# Patient Record
Sex: Female | Born: 1970 | Race: White | Hispanic: No | Marital: Married | Smoking: Never smoker
Health system: Southern US, Community
[De-identification: ages and names within clinical notes are randomized; demographics above are authoritative.]

## PROBLEM LIST (undated history)

## (undated) DIAGNOSIS — D649 Anemia, unspecified: Secondary | ICD-10-CM

## (undated) DIAGNOSIS — M7732 Calcaneal spur, left foot: Secondary | ICD-10-CM

## (undated) DIAGNOSIS — Z8742 Personal history of other diseases of the female genital tract: Secondary | ICD-10-CM

## (undated) DIAGNOSIS — S83207A Unspecified tear of unspecified meniscus, current injury, left knee, initial encounter: Secondary | ICD-10-CM

## (undated) DIAGNOSIS — S83206A Unspecified tear of unspecified meniscus, current injury, right knee, initial encounter: Secondary | ICD-10-CM

## (undated) DIAGNOSIS — E785 Hyperlipidemia, unspecified: Secondary | ICD-10-CM

## (undated) HISTORY — DX: Anemia, unspecified: D64.9

## (undated) HISTORY — DX: Unspecified tear of unspecified meniscus, current injury, left knee, initial encounter: S83.207A

## (undated) HISTORY — DX: Unspecified tear of unspecified meniscus, current injury, right knee, initial encounter: S83.206A

## (undated) HISTORY — DX: Calcaneal spur, left foot: M77.32

## (undated) HISTORY — DX: Personal history of other diseases of the female genital tract: Z87.42

## (undated) HISTORY — DX: Hyperlipidemia, unspecified: E78.5

---

## 2004-04-13 DIAGNOSIS — Z8742 Personal history of other diseases of the female genital tract: Secondary | ICD-10-CM

## 2004-04-13 HISTORY — DX: Personal history of other diseases of the female genital tract: Z87.42

## 2004-04-13 HISTORY — PX: COLPOSCOPY: SHX161

## 2006-04-13 HISTORY — PX: DILATION AND CURETTAGE OF UTERUS: SHX78

## 2006-05-06 ENCOUNTER — Ambulatory Visit: Payer: Self-pay | Admitting: Obstetrics & Gynecology

## 2011-06-25 ENCOUNTER — Observation Stay: Payer: Self-pay | Admitting: Obstetrics and Gynecology

## 2011-06-29 ENCOUNTER — Inpatient Hospital Stay: Payer: Self-pay | Admitting: Internal Medicine

## 2011-06-29 LAB — CBC WITH DIFFERENTIAL/PLATELET
Basophil %: 0.3 %
Eosinophil %: 0.6 %
HGB: 11.8 g/dL — ABNORMAL LOW (ref 12.0–16.0)
Lymphocyte %: 17.2 %
MCV: 94 fL (ref 80–100)
Monocyte %: 6.1 %
Neutrophil %: 75.8 %
Platelet: 233 10*3/uL (ref 150–440)
RBC: 3.63 10*6/uL — ABNORMAL LOW (ref 3.80–5.20)
RDW: 13.4 % (ref 11.5–14.5)
WBC: 14.3 10*3/uL — ABNORMAL HIGH (ref 3.6–11.0)

## 2013-02-11 DIAGNOSIS — M7732 Calcaneal spur, left foot: Secondary | ICD-10-CM

## 2013-02-11 HISTORY — DX: Calcaneal spur, left foot: M77.32

## 2013-04-26 ENCOUNTER — Ambulatory Visit: Payer: Self-pay | Admitting: Orthopedic Surgery

## 2013-05-19 ENCOUNTER — Ambulatory Visit (INDEPENDENT_AMBULATORY_CARE_PROVIDER_SITE_OTHER): Payer: BC Managed Care – PPO

## 2013-05-19 ENCOUNTER — Encounter: Payer: Self-pay | Admitting: Podiatry

## 2013-05-19 ENCOUNTER — Ambulatory Visit (INDEPENDENT_AMBULATORY_CARE_PROVIDER_SITE_OTHER): Payer: BC Managed Care – PPO | Admitting: Podiatry

## 2013-05-19 VITALS — BP 141/85 | HR 72 | Resp 16 | Ht 65.0 in | Wt 157.0 lb

## 2013-05-19 DIAGNOSIS — M722 Plantar fascial fibromatosis: Secondary | ICD-10-CM

## 2013-05-19 MED ORDER — DICLOFENAC SODIUM 75 MG PO TBEC: 75.0000 mg | DELAYED_RELEASE_TABLET | Freq: Two times a day (BID) | ORAL | Status: DC

## 2013-05-19 MED ORDER — TRIAMCINOLONE ACETONIDE 10 MG/ML IJ SUSP
10.0000 mg | Freq: Once | INTRAMUSCULAR | Status: AC
  Administered 2013-05-19: 10 mg

## 2013-05-19 NOTE — Progress Notes (Signed)
   Subjective:    Patient ID: Marie Benson, female    DOB: 12/24/1970, 43 y.o.   MRN: [id number]  HPI Comments: The left heel is killing me it throbs, i workout and the day after working out i can barely walk on it, it hurts all the time. This has been going on since thanksgiving . Took advil and aleve nothing seems to work .  Foot Pain      Review of Systems     Objective:   Physical Exam        Assessment & Plan:

## 2013-05-19 NOTE — Patient Instructions (Signed)
Plantar Fasciitis (Heel Spur Syndrome) with Rehab The plantar fascia is a fibrous, ligament-like, soft-tissue structure that spans the bottom of the foot. Plantar fasciitis is a condition that causes pain in the foot due to inflammation of the tissue. SYMPTOMS   Pain and tenderness on the underneath side of the foot.  Pain that worsens with standing or walking. CAUSES  Plantar fasciitis is caused by irritation and injury to the plantar fascia on the underneath side of the foot. Common mechanisms of injury include:  Direct trauma to bottom of the foot.  Damage to a small nerve that runs under the foot where the main fascia attaches to the heel bone.  Stress placed on the plantar fascia due to bone spurs. RISK INCREASES WITH:   Activities that place stress on the plantar fascia (running, jumping, pivoting, or cutting).  Poor strength and flexibility.  Improperly fitted shoes.  Tight calf muscles.  Flat feet.  Failure to warm-up properly before activity.  Obesity. PREVENTION  Warm up and stretch properly before activity.  Allow for adequate recovery between workouts.  Maintain physical fitness:  Strength, flexibility, and endurance.  Cardiovascular fitness.  Maintain a health body weight.  Avoid stress on the plantar fascia.  Wear properly fitted shoes, including arch supports for individuals who have flat feet. PROGNOSIS  If treated properly, then the symptoms of plantar fasciitis usually resolve without surgery. However, occasionally surgery is necessary. RELATED COMPLICATIONS   Recurrent symptoms that may result in a chronic condition.  Problems of the lower back that are caused by compensating for the injury, such as limping.  Pain or weakness of the foot during push-off following surgery.  Chronic inflammation, scarring, and partial or complete fascia tear, occurring more often from repeated injections. TREATMENT  Treatment initially involves the use of  ice and medication to help reduce pain and inflammation. The use of strengthening and stretching exercises may help reduce pain with activity, especially stretches of the Achilles tendon. These exercises may be performed at home or with a therapist. Your caregiver may recommend that you use heel cups of arch supports to help reduce stress on the plantar fascia. Occasionally, corticosteroid injections are given to reduce inflammation. If symptoms persist for greater than 6 months despite non-surgical (conservative), then surgery may be recommended.  MEDICATION   If pain medication is necessary, then nonsteroidal anti-inflammatory medications, such as aspirin and ibuprofen, or other minor pain relievers, such as acetaminophen, are often recommended.  Do not take pain medication within 7 days before surgery.  Prescription pain relievers may be given if deemed necessary by your caregiver. Use only as directed and only as much as you need.  Corticosteroid injections may be given by your caregiver. These injections should be reserved for the most serious cases, because they may only be given a certain number of times. HEAT AND COLD  Cold treatment (icing) relieves pain and reduces inflammation. Cold treatment should be applied for 10 to 15 minutes every 2 to 3 hours for inflammation and pain and immediately after any activity that aggravates your symptoms. Use ice packs or massage the area with a piece of ice (ice massage).  Heat treatment may be used prior to performing the stretching and strengthening activities prescribed by your caregiver, physical therapist, or athletic trainer. Use a heat pack or soak the injury in warm water. SEEK IMMEDIATE MEDICAL CARE IF:  Treatment seems to offer no benefit, or the condition worsens.  Any medications produce adverse side effects. EXERCISES RANGE   OF MOTION (ROM) AND STRETCHING EXERCISES - Plantar Fasciitis (Heel Spur Syndrome) These exercises may help you  when beginning to rehabilitate your injury. Your symptoms may resolve with or without further involvement from your physician, physical therapist or athletic trainer. While completing these exercises, remember:   Restoring tissue flexibility helps normal motion to return to the joints. This allows healthier, less painful movement and activity.  An effective stretch should be held for at least 30 seconds.  A stretch should never be painful. You should only feel a gentle lengthening or release in the stretched tissue. RANGE OF MOTION - Toe Extension, Flexion  Sit with your right / left leg crossed over your opposite knee.  Grasp your toes and gently pull them back toward the top of your foot. You should feel a stretch on the bottom of your toes and/or foot.  Hold this stretch for __________ seconds.  Now, gently pull your toes toward the bottom of your foot. You should feel a stretch on the top of your toes and or foot.  Hold this stretch for __________ seconds. Repeat __________ times. Complete this stretch __________ times per day.  RANGE OF MOTION - Ankle Dorsiflexion, Active Assisted  Remove shoes and sit on a chair that is preferably not on a carpeted surface.  Place right / left foot under knee. Extend your opposite leg for support.  Keeping your heel down, slide your right / left foot back toward the chair until you feel a stretch at your ankle or calf. If you do not feel a stretch, slide your bottom forward to the edge of the chair, while still keeping your heel down.  Hold this stretch for __________ seconds. Repeat __________ times. Complete this stretch __________ times per day.  STRETCH  Gastroc, Standing  Place hands on wall.  Extend right / left leg, keeping the front knee somewhat bent.  Slightly point your toes inward on your back foot.  Keeping your right / left heel on the floor and your knee straight, shift your weight toward the wall, not allowing your back to  arch.  You should feel a gentle stretch in the right / left calf. Hold this position for __________ seconds. Repeat __________ times. Complete this stretch __________ times per day. STRETCH  Soleus, Standing  Place hands on wall.  Extend right / left leg, keeping the other knee somewhat bent.  Slightly point your toes inward on your back foot.  Keep your right / left heel on the floor, bend your back knee, and slightly shift your weight over the back leg so that you feel a gentle stretch deep in your back calf.  Hold this position for __________ seconds. Repeat __________ times. Complete this stretch __________ times per day. STRETCH  Gastrocsoleus, Standing  Note: This exercise can place a lot of stress on your foot and ankle. Please complete this exercise only if specifically instructed by your caregiver.   Place the ball of your right / left foot on a step, keeping your other foot firmly on the same step.  Hold on to the wall or a rail for balance.  Slowly lift your other foot, allowing your body weight to press your heel down over the edge of the step.  You should feel a stretch in your right / left calf.  Hold this position for __________ seconds.  Repeat this exercise with a slight bend in your right / left knee. Repeat __________ times. Complete this stretch __________ times per day.    STRENGTHENING EXERCISES - Plantar Fasciitis (Heel Spur Syndrome)  These exercises may help you when beginning to rehabilitate your injury. They may resolve your symptoms with or without further involvement from your physician, physical therapist or athletic trainer. While completing these exercises, remember:   Muscles can gain both the endurance and the strength needed for everyday activities through controlled exercises.  Complete these exercises as instructed by your physician, physical therapist or athletic trainer. Progress the resistance and repetitions only as guided. STRENGTH - Towel  Curls  Sit in a chair positioned on a non-carpeted surface.  Place your foot on a towel, keeping your heel on the floor.  Pull the towel toward your heel by only curling your toes. Keep your heel on the floor.  If instructed by your physician, physical therapist or athletic trainer, add ____________________ at the end of the towel. Repeat __________ times. Complete this exercise __________ times per day. STRENGTH - Ankle Inversion  Secure one end of a rubber exercise band/tubing to a fixed object (table, pole). Loop the other end around your foot just before your toes.  Place your fists between your knees. This will focus your strengthening at your ankle.  Slowly, pull your big toe up and in, making sure the band/tubing is positioned to resist the entire motion.  Hold this position for __________ seconds.  Have your muscles resist the band/tubing as it slowly pulls your foot back to the starting position. Repeat __________ times. Complete this exercises __________ times per day.  Document Released: 03/30/2005 Document Revised: 06/22/2011 Document Reviewed: 07/12/2008 Exit[employer] Patient Information 2014 [employer]. Plantar Fasciitis Plantar fasciitis is a common condition that causes foot pain. It is soreness (inflammation) of the band of tough fibrous tissue on the bottom of the foot that runs from the heel bone (calcaneus) to the ball of the foot. The cause of this soreness may be from excessive standing, poor fitting shoes, running on hard surfaces, being overweight, having an abnormal walk, or overuse (this is common in runners) of the painful foot or feet. It is also common in aerobic exercise dancers and ballet dancers. SYMPTOMS  Most people with plantar fasciitis complain of:  Severe pain in the morning on the bottom of their foot especially when taking the first steps out of bed. This pain recedes after a few minutes of walking.  Severe pain is experienced also during walking  following a long period of inactivity.  Pain is worse when walking barefoot or up stairs DIAGNOSIS   Your caregiver will diagnose this condition by examining and feeling your foot.  Special tests such as X-rays of your foot, are usually not needed. PREVENTION   Consult a sports medicine professional before beginning a new exercise program.  Walking programs offer a good workout. With walking there is a lower chance of overuse injuries common to runners. There is less impact and less jarring of the joints.  Begin all new exercise programs slowly. If problems or pain develop, decrease the amount of time or distance until you are at a comfortable level.  Wear good shoes and replace them regularly.  Stretch your foot and the heel cords at the back of the ankle (Achilles tendon) both before and after exercise.  Run or exercise on even surfaces that are not hard. For example, asphalt is better than pavement.  Do not run barefoot on hard surfaces.  If using a treadmill, vary the incline.  Do not continue to workout if you have foot or joint   problems. Seek professional help if they do not improve. HOME CARE INSTRUCTIONS   Avoid activities that cause you pain until you recover.  Use ice or cold packs on the problem or painful areas after working out.  Only take over-the-counter or prescription medicines for pain, discomfort, or fever as directed by your caregiver.  Soft shoe inserts or athletic shoes with air or gel sole cushions may be helpful.  If problems continue or become more severe, consult a sports medicine caregiver or your own health care provider. Cortisone is a potent anti-inflammatory medication that may be injected into the painful area. You can discuss this treatment with your caregiver. MAKE SURE YOU:   Understand these instructions.  Will watch your condition.  Will get help right away if you are not doing well or get worse. Document Released: 12/23/2000 Document  Revised: 06/22/2011 Document Reviewed: 02/22/2008 Exit[employer] Patient Information 2014 [employer].  

## 2013-05-19 NOTE — Progress Notes (Signed)
Subjective:     Patient ID: Kathrin PennerShanna Buchanan, female   DOB: 01/05/1971, 43 y.o.   MRN: [id number]  Foot Pain   patient states that she's been having a lot of pain in her left heel about approximately 3 months duration. She is very active states it's hard when exercising and now hurts at all times   Review of Systems  All other systems reviewed and are negative.       Objective:   Physical Exam  Nursing note and vitals reviewed. Constitutional: She is oriented to person, place, and time.  Cardiovascular: Intact distal pulses.   Musculoskeletal: Normal range of motion.  Neurological: She is oriented to person, place, and time.  Skin: Skin is warm.   neurovascular status intact with health history unchanged and severe discomfort plantar heel left at the insertional point of the tendon into the calcaneus. Muscle strength adequate range of motion within normal limits and no equinus condition noted     Assessment:     Plantar fasciitis acute nature left heel    Plan:     H&P and x-ray reviewed. Injected the left plantar fascia 3 mg Kenalog 5 mg I can Marcaine mixture and applied fascially brace and placed on Voltaren 75 mg twice a day. Reappoint one week

## 2013-05-26 ENCOUNTER — Ambulatory Visit: Payer: BC Managed Care – PPO | Admitting: Podiatry

## 2013-06-13 ENCOUNTER — Ambulatory Visit (INDEPENDENT_AMBULATORY_CARE_PROVIDER_SITE_OTHER): Payer: BC Managed Care – PPO | Admitting: Podiatry

## 2013-06-13 VITALS — BP 130/78 | HR 78 | Resp 16 | Ht 64.0 in | Wt 161.0 lb

## 2013-06-13 DIAGNOSIS — M722 Plantar fascial fibromatosis: Secondary | ICD-10-CM

## 2013-06-13 NOTE — Progress Notes (Signed)
Subjective:     Patient ID: Marie PennerShanna Benson, female   DOB: 10/16/1970, 43 y.o.   MRN: [id number]  HPI patient states my heel left is somewhat improved but still sore and my right knee is probably going to need surgery and I feel like I walk differently   Review of Systems     Objective:   Physical Exam Neurovascular status intact with no change in health history and tenderness still noted plantar heel left not as intense as first visit    Assessment:     Plantar fasciitis left still present especially after sleeping and after periods of activity    Plan:     Advised on physical therapy and dispensed night splint in order to stretch the plantar fascia along with ice packs and scanned for custom orthotics to reduce the mechanical stress against the arch and heel. Reappoint when orthotics returned

## 2013-06-28 ENCOUNTER — Telehealth: Payer: Self-pay | Admitting: Podiatry

## 2013-07-04 ENCOUNTER — Ambulatory Visit (INDEPENDENT_AMBULATORY_CARE_PROVIDER_SITE_OTHER): Payer: BC Managed Care – PPO | Admitting: Podiatry

## 2013-07-04 ENCOUNTER — Encounter: Payer: Self-pay | Admitting: Podiatry

## 2013-07-04 VITALS — BP 112/77 | HR 84 | Resp 12

## 2013-07-04 DIAGNOSIS — M7750 Other enthesopathy of unspecified foot: Secondary | ICD-10-CM

## 2013-07-04 DIAGNOSIS — M722 Plantar fascial fibromatosis: Secondary | ICD-10-CM

## 2013-07-04 NOTE — Patient Instructions (Signed)

## 2013-07-05 NOTE — Progress Notes (Signed)
Subjective:     Patient ID: Marie Benson, female   DOB: 04/10/1971, 43 y.o.   MRN: [id number]  HPI patient states that her heel has been feeling pretty good at that she's been getting a lot of pain in her ankle both feet and in the outside of the left foot. She is also due [facility]   Review of Systems     Objective:   Physical Exam Neurovascular status intact with patient well oriented in no changes in health except for knee. Patient's left heel feels good but there is discomfort in the sinus tarsi of a moderate nature and in the peroneal base insertion left    Assessment:     Compensation creating tendinitis-like symptomatology left with possibility for systemic disease which is being checked [facility]    Plan:     Review of orthotics and discussed gradual increase in activity levels. She'll be reviewed again in 8 weeks earlier if any issues should occur

## 2013-07-25 ENCOUNTER — Ambulatory Visit (INDEPENDENT_AMBULATORY_CARE_PROVIDER_SITE_OTHER): Payer: BC Managed Care – PPO | Admitting: Podiatry

## 2013-07-25 ENCOUNTER — Ambulatory Visit (INDEPENDENT_AMBULATORY_CARE_PROVIDER_SITE_OTHER): Payer: BC Managed Care – PPO

## 2013-07-25 VITALS — Resp 16 | Ht 64.0 in | Wt 160.0 lb

## 2013-07-25 DIAGNOSIS — M79673 Pain in unspecified foot: Secondary | ICD-10-CM

## 2013-07-25 DIAGNOSIS — M7750 Other enthesopathy of unspecified foot: Secondary | ICD-10-CM

## 2013-07-25 DIAGNOSIS — M79609 Pain in unspecified limb: Secondary | ICD-10-CM

## 2013-07-25 MED ORDER — TRIAMCINOLONE ACETONIDE 10 MG/ML IJ SUSP
10.0000 mg | Freq: Once | INTRAMUSCULAR | Status: AC
  Administered 2013-07-25: 10 mg

## 2013-07-26 NOTE — Progress Notes (Signed)
Subjective:     Patient ID: Marie PennerShanna Benson, female   DOB: 04/08/1971, 43 y.o.   MRN: [id number]  HPI patient states the outside of her left foot is killing her and she can not bear weight. States the heel and ankles are feeling better   Review of Systems     Objective:   Physical Exam Neurovascular status intact with exquisite discomfort lateral side left foot at the peroneal insertion base of fifth metatarsal    Assessment:     Acute tendinitis left    Plan:     Careful injection of the peroneal fifth metatarsal base 3 mg Kenalog 5 mg Xylocaine Marcaine mixture and advised him reduced activity for several days

## 2013-08-01 HISTORY — PX: KNEE SURGERY: SHX244

## 2013-09-05 ENCOUNTER — Ambulatory Visit: Payer: BC Managed Care – PPO | Admitting: Podiatry

## 2013-09-08 ENCOUNTER — Ambulatory Visit: Payer: BC Managed Care – PPO | Admitting: Podiatry

## 2013-11-30 ENCOUNTER — Ambulatory Visit: Payer: Self-pay

## 2014-08-21 NOTE — H&P (Signed)
L&D Evaluation:  History:   HPI 44 year old G3 p1011 with EDC=07/06/2011 by a 6 week ultrasound presents at 39 weeks with c/o contractions since this AM, becoming stronger after SROM for clear fluid at 1015 this AM. [place] in [place]. She has just [facility] earlier this month. PNC has been remarkable for AMA with negative sequential screening, and anemia for which she takes vitamins and FE Hx of SVD of a 7#10oz female in 08/2007 without complications. LABS: O POS, VI, GBS negative, Rubella equivocal.    Presents with contractions, leaking fluid    Patient's Medical History No Chronic Illness    Patient's Surgical History D&C    Medications Pre Natal Vitamins  Iron    Allergies NKDA    Social History none    Family History Non-Contributory   ROS:   ROS see HPI   Exam:   Vital Signs stable    General breathing with some contractions    Mental Status clear    Chest clear    Heart normal sinus rhythm, no murmur/gallop/rubs    Abdomen gravid, tender with contractions    Estimated Fetal Weight Average for gestational age    Fetal Position vtx    Reflexes 2+    Pelvic no external lesions, 3.5/80%/-1    Mebranes Ruptured    Description clear    FHT normal rate with no decels, 140s with accels to 160s-170s    FHT Description moderate variability    Fetal Heart Rate 145     Ucx regular, about q3    Skin dry   Impression:   Impression IUP at 39 weeks with active labor and SROM   Plan:   Plan EFM/NST, monitor contractions and for cervical change, Epiodural form pain   Electronic Signatures: Trinna BalloonGutierrez, Jinny Sweetland L (CNM)  (Signed 18-Mar-13 15:03)  Authored: L&D Evaluation   Last Updated: 18-Mar-13 15:03 by Trinna BalloonGutierrez, Clement Deneault L (CNM)

## 2015-12-24 ENCOUNTER — Other Ambulatory Visit: Payer: Self-pay | Admitting: Certified Nurse Midwife

## 2015-12-24 DIAGNOSIS — Z1231 Encounter for screening mammogram for malignant neoplasm of breast: Secondary | ICD-10-CM

## 2016-01-09 ENCOUNTER — Encounter: Payer: Self-pay | Admitting: Radiology

## 2016-01-09 ENCOUNTER — Ambulatory Visit
Admission: RE | Admit: 2016-01-09 | Discharge: 2016-01-09 | Disposition: A | Discharge status: Home / Self Care | Payer: BLUE CROSS/BLUE SHIELD | Source: Ambulatory Visit | Attending: Certified Nurse Midwife | Admitting: Certified Nurse Midwife

## 2016-01-09 DIAGNOSIS — R928 Other abnormal and inconclusive findings on diagnostic imaging of breast: Secondary | ICD-10-CM | POA: Insufficient documentation

## 2016-01-09 DIAGNOSIS — Z1231 Encounter for screening mammogram for malignant neoplasm of breast: Secondary | ICD-10-CM | POA: Insufficient documentation

## 2016-01-13 ENCOUNTER — Other Ambulatory Visit: Payer: Self-pay | Admitting: Certified Nurse Midwife

## 2016-01-13 DIAGNOSIS — N6320 Unspecified lump in the left breast, unspecified quadrant: Secondary | ICD-10-CM

## 2016-01-21 ENCOUNTER — Ambulatory Visit
Admission: RE | Admit: 2016-01-21 | Discharge: 2016-01-21 | Disposition: A | Discharge status: Home / Self Care | Payer: BLUE CROSS/BLUE SHIELD | Source: Ambulatory Visit | Attending: Certified Nurse Midwife | Admitting: Certified Nurse Midwife

## 2016-01-21 DIAGNOSIS — N6320 Unspecified lump in the left breast, unspecified quadrant: Secondary | ICD-10-CM | POA: Diagnosis not present

## 2016-04-29 HISTORY — PX: KNEE ARTHROSCOPY: SUR90

## 2016-11-21 ENCOUNTER — Other Ambulatory Visit: Payer: Self-pay | Admitting: Certified Nurse Midwife

## 2016-12-10 ENCOUNTER — Ambulatory Visit: Payer: Self-pay | Admitting: Certified Nurse Midwife

## 2017-01-06 NOTE — Progress Notes (Signed)
Gynecology Annual Exam  PCP: Patient, No Pcp Per  Chief Complaint:  Chief Complaint  Patient presents with  . Gynecologic Exam    History of Present Illness:Marie Benson is a 46 year old Caucasian/White female , G 3 P 2 0 1 2 , who presents for her annual exam . She is not having any significant gyn problems.  She is amenorrheic on her Junel 1/20.  Will occasionally have spotting for a withdrawal bleed. No night sweats or hot flashes She has had no intermenstrual spotting.  She denies dysmenorrhea.  The patient's past medical history is notable for bilateral meniscus tears in her knees  Since her last annual GYN exam dated 11/11/2015 , she has had arthroscopic surgery on her left knee to repair the meniscus tear. She is sexually active. She is currently using birth control pills for contraception.  Her most recent pap smear was obtained 11/11/2015 and was ASCUS with negative HRHPV. Remote history of positive HRHPV in 2006 with coloposcopy. Normal Pap smears since then Her most recent mammogram obtained on 01/09/2016 was Birads 0 requiring additional views on the left breast which were negative. There is no family history of breast cancer.  There is no family history of ovarian cancer.  The patient does do monthly self breast exams.  The patient does not smoke.  The patient does drink 2 glass of wine/ week The patient does not use illegal drugs.  The patient exercises regularly. The patient does get adequate calcium in her diet.  She had a cholesterol screen in 2014 that was normal. She thinks she had a cholesterol screen last year that was normal.     The patient denies current symptoms of depression.    Review of Systems: Review of Systems  Constitutional: Negative for chills, fever and weight loss.  HENT: Negative for congestion, sinus pain and sore throat.   Eyes: Negative for blurred vision and pain.  Respiratory: Negative for hemoptysis, shortness of breath and  wheezing.   Cardiovascular: Negative for chest pain, palpitations and leg swelling.  Gastrointestinal: Negative for abdominal pain, blood in stool, diarrhea, heartburn, nausea and vomiting.  Genitourinary: Negative for dysuria, frequency, hematuria and urgency.  Musculoskeletal: Negative for back pain, joint pain and myalgias.  Skin: Negative for itching and rash.  Neurological: Negative for dizziness, tingling and headaches.  Endo/Heme/Allergies: Negative for environmental allergies and polydipsia. Does not bruise/bleed easily.       Negative for hirsutism   Psychiatric/Behavioral: Negative for depression. The patient is not nervous/anxious and does not have insomnia.     Past Medical History:  Past Medical History:  Diagnosis Date  . Anemia   . Heel spur, left 02/2013  . History of abnormal cervical Pap smear 2006   positive HRHPV  . Tear of meniscus of left knee   . Tear of meniscus of right knee     Past Surgical History:  Past Surgical History:  Procedure Laterality Date  . COLPOSCOPY  2006  . DILATION AND CURETTAGE OF UTERUS  2008   missed abortion  . KNEE ARTHROSCOPY Left 04/29/2016  . KNEE SURGERY Right 08/01/2013   repair of meniscus    Family History:  Family History  Problem Relation Age of Onset  . Hypertension Mother   . Diabetes Sister   . Colon cancer Maternal Grandfather 80  . Bladder Cancer Maternal Aunt 59    Social History:  Social History   Social History  . Marital status: Married  Spouse name: N/A  . Number of children: 2  . Years of education: 16   Occupational History  . Homemaker    Social History Main Topics  . Smoking status: Never Smoker  . Smokeless tobacco: Never Used  . Alcohol use 1.2 oz/week    2 Glasses of wine per week  . Drug use: No  . Sexual activity: Yes    Partners: Male    Birth control/ protection: Pill   Other Topics Concern  . Not on file   Social History Narrative   Degree in accounting. Serves as  Musician for [employer]    Allergies:  No Known Allergies  Medications: Prior to Admission medications   Medication Sig Start Date End Date Taking? Authorizing Provider  JUNEL FE 1/20 1-20 MG-MCG tablet TAKE 1 TABLET BY MOUTH EVERY DAY 11/23/16   Farrel Conners, CNM  Does take a multivitamin  Physical Exam Vitals:BP 130/86   Pulse 80   Ht  (1.676 m)   Wt 167 lb (75.8 kg)   BMI 26.95 kg/m  General:WF in  NAD HEENT: normocephalic, anicteric Neck: no thyroid enlargement, no palpable nodules, no cervical lymphadenopathy  Pulmonary: No increased work of breathing, CTAB Cardiovascular: RRR, without murmur  Breast: Breast symmetrical, no tenderness, no palpable nodules or masses, no skin or nipple retraction present, no nipple discharge.  No axillary, infraclavicular or supraclavicular lymphadenopathy. Abdomen: Soft, non-tender, non-distended.  Umbilicus without lesions.  No hepatomegaly or masses palpable. No evidence of hernia. Genitourinary:  External: Normal external female genitalia.  Normal urethral meatus, normal Bartholin's and Skene's glands.    Vagina: Normal vaginal mucosa, no evidence of prolapse.    Cervix: Grossly normal in appearance, everted, parous, non-tender  Uterus: MP to Retroverted, normal size, shape, and consistency, mobile, and non-tender  Adnexa: No adnexal masses, non-tender  Rectal: deferred  Lymphatic: no evidence of inguinal lymphadenopathy Extremities: no edema, erythema, or tenderness Neurologic: Grossly intact Psychiatric: mood appropriate, affect full     Assessment: 46 y.o. [id number] normal annual gyn exam Amenorrhea on Junel 1/20  Plan:   1) Breast cancer screening - recommend monthly self breast exam and annual screening mammograms. Mammogram was ordered today. Patient to schedule at [facility]  2) Cervical cancer screening - Pap was done.  3) Contraception -Refills for Junel 1/20 x 1 year  4) Routine healthcare maintenance including  cholesterol and diabetes screening due next year.   5) RTO for annual in 1 year  Farrel Conners, [place]

## 2017-01-07 ENCOUNTER — Encounter: Payer: Self-pay | Admitting: Certified Nurse Midwife

## 2017-01-07 ENCOUNTER — Ambulatory Visit (INDEPENDENT_AMBULATORY_CARE_PROVIDER_SITE_OTHER): Payer: BLUE CROSS/BLUE SHIELD | Admitting: Certified Nurse Midwife

## 2017-01-07 VITALS — BP 130/86 | HR 80 | Ht 66.0 in | Wt 167.0 lb

## 2017-01-07 DIAGNOSIS — Z01419 Encounter for gynecological examination (general) (routine) without abnormal findings: Secondary | ICD-10-CM | POA: Diagnosis not present

## 2017-01-07 DIAGNOSIS — Z309 Encounter for contraceptive management, unspecified: Secondary | ICD-10-CM | POA: Insufficient documentation

## 2017-01-07 DIAGNOSIS — Z3041 Encounter for surveillance of contraceptive pills: Secondary | ICD-10-CM | POA: Diagnosis not present

## 2017-01-07 DIAGNOSIS — Z1231 Encounter for screening mammogram for malignant neoplasm of breast: Secondary | ICD-10-CM

## 2017-01-07 DIAGNOSIS — Z124 Encounter for screening for malignant neoplasm of cervix: Secondary | ICD-10-CM | POA: Diagnosis not present

## 2017-01-07 DIAGNOSIS — Z1239 Encounter for other screening for malignant neoplasm of breast: Secondary | ICD-10-CM

## 2017-01-07 MED ORDER — NORETHIN ACE-ETH ESTRAD-FE 1-20 MG-MCG PO TABS: 1.0000 | ORAL_TABLET | Freq: Every day | ORAL | 3 refills | Status: DC

## 2017-01-11 LAB — IGP, APTIMA HPV
HPV APTIMA: NEGATIVE
PAP Smear Comment: 0

## 2017-01-12 ENCOUNTER — Telehealth: Payer: Self-pay | Admitting: Certified Nurse Midwife

## 2017-01-12 NOTE — Telephone Encounter (Signed)
Called regarding ASCUS Pap smear with negative HRHPV-repeat Pap in 1 year

## 2017-03-12 ENCOUNTER — Ambulatory Visit
Admission: RE | Admit: 2017-03-12 | Discharge: 2017-03-12 | Disposition: A | Discharge status: Home / Self Care | Payer: BLUE CROSS/BLUE SHIELD | Source: Ambulatory Visit | Attending: Certified Nurse Midwife | Admitting: Certified Nurse Midwife

## 2017-03-12 DIAGNOSIS — Z1239 Encounter for other screening for malignant neoplasm of breast: Secondary | ICD-10-CM

## 2017-03-12 DIAGNOSIS — Z1231 Encounter for screening mammogram for malignant neoplasm of breast: Secondary | ICD-10-CM | POA: Insufficient documentation

## 2018-01-17 ENCOUNTER — Other Ambulatory Visit: Payer: Self-pay | Admitting: Certified Nurse Midwife

## 2018-02-04 ENCOUNTER — Encounter: Payer: Self-pay | Admitting: Certified Nurse Midwife

## 2018-02-04 ENCOUNTER — Ambulatory Visit (INDEPENDENT_AMBULATORY_CARE_PROVIDER_SITE_OTHER): Payer: BLUE CROSS/BLUE SHIELD | Admitting: Certified Nurse Midwife

## 2018-02-04 ENCOUNTER — Other Ambulatory Visit (HOSPITAL_COMMUNITY)
Admission: RE | Admit: 2018-02-04 | Discharge: 2018-02-04 | Disposition: A | Discharge status: Home / Self Care | Payer: BLUE CROSS/BLUE SHIELD | Source: Ambulatory Visit | Attending: Certified Nurse Midwife | Admitting: Certified Nurse Midwife

## 2018-02-04 VITALS — BP 130/80 | HR 96 | Ht 67.0 in | Wt 168.0 lb

## 2018-02-04 DIAGNOSIS — R87610 Atypical squamous cells of undetermined significance on cytologic smear of cervix (ASC-US): Secondary | ICD-10-CM

## 2018-02-04 DIAGNOSIS — Z01419 Encounter for gynecological examination (general) (routine) without abnormal findings: Secondary | ICD-10-CM | POA: Insufficient documentation

## 2018-02-04 DIAGNOSIS — Z131 Encounter for screening for diabetes mellitus: Secondary | ICD-10-CM

## 2018-02-04 DIAGNOSIS — Z13220 Encounter for screening for lipoid disorders: Secondary | ICD-10-CM

## 2018-02-04 DIAGNOSIS — Z124 Encounter for screening for malignant neoplasm of cervix: Secondary | ICD-10-CM

## 2018-02-04 DIAGNOSIS — Z1239 Encounter for other screening for malignant neoplasm of breast: Secondary | ICD-10-CM

## 2018-02-04 MED ORDER — NORETHIN ACE-ETH ESTRAD-FE 1-20 MG-MCG PO TABS: 1.0000 | ORAL_TABLET | Freq: Every day | ORAL | 3 refills | Status: DC

## 2018-02-04 NOTE — Progress Notes (Signed)
Gynecology Annual Exam  PCP: Patient, No Pcp Per  Chief Complaint:  Chief Complaint  Patient presents with  . Gynecologic Exam    History of Present Illness:Marie Benson is a 47 year old Caucasian/White female , G 3 P 2 0 1 2 , who presents for her annual exam . She is not having any significant gyn problems.  She is usually amenorrheic on her Junel 1/20.  Has had a light withdrawal bleed lasting 2-3 days for the last 3 months. LMP was 01/26/2018. She has had no intermenstrual spotting.  She has had some mild cramping with the last 3 menses. The patient's past medical history is notable for bilateral meniscus tears in her knees  Since her last annual GYN exam dated 01/07/2017 , she has had no significant changes in her health. She is sexually active. She is currently using birth control pills for contraception.  Her most recent pap smear was obtained 01/07/2017 and was ASCUS with negative HRHPV. Remote history of positive HRHPV in 2006 with coloposcopy. Her most recent mammogram obtained on 03/12/2017 was Birads 1c There is no family history of breast cancer.  There is no family history of ovarian cancer.  The patient does do monthly self breast exams.  The patient does not smoke.  The patient does drink 2-3 glass of wine/ week The patient does not use illegal drugs.  The patient exercises regularly (trainer 3x/wk and spin classes 5x/week) The patient does get adequate calcium in her diet.  She had a cholesterol screen in 2014 that was normal   The patient denies current symptoms of depression.    Review of Systems: Review of Systems  Constitutional: Negative for chills, fever and weight loss.  HENT: Negative for congestion, sinus pain and sore throat.   Eyes: Negative for blurred vision and pain.  Respiratory: Negative for hemoptysis, shortness of breath and wheezing.   Cardiovascular: Negative for chest pain, palpitations and leg swelling.  Gastrointestinal:  Negative for abdominal pain, blood in stool, diarrhea, heartburn, nausea and vomiting.  Genitourinary: Negative for dysuria, frequency, hematuria and urgency.  Musculoskeletal: Positive for myalgias (from exercise). Negative for back pain and joint pain.  Skin: Negative for itching and rash.  Neurological: Negative for dizziness, tingling and headaches.  Endo/Heme/Allergies: Negative for environmental allergies and polydipsia. Does not bruise/bleed easily.       Negative for hirsutism   Psychiatric/Behavioral: Negative for depression. The patient is not nervous/anxious and does not have insomnia.     Past Medical History:  Past Medical History:  Diagnosis Date  . Anemia   . Heel spur, left 02/2013  . History of abnormal cervical Pap smear 2006   positive HRHPV  . Tear of meniscus of left knee   . Tear of meniscus of right knee     Past Surgical History:  Past Surgical History:  Procedure Laterality Date  . COLPOSCOPY  2006  . DILATION AND CURETTAGE OF UTERUS  2008   missed abortion  . KNEE ARTHROSCOPY Left 04/29/2016  . KNEE SURGERY Right 08/01/2013   repair of meniscus    Family History:  Family History  Problem Relation Age of Onset  . Hypertension Mother   . Diabetes Sister   . Colon cancer Maternal Grandfather 80  . Bladder Cancer Maternal Aunt 35    Social History:  Social History   Socioeconomic History  . Marital status: Married    Spouse name: Not on file  . Number of  children: 2  . Years of education: 27  . Highest education level: Not on file  Occupational History  . Occupation: Futures trader  Social Needs  . Financial resource strain: Not on file  . Food insecurity:    Worry: Not on file    Inability: Not on file  . Transportation needs:    Medical: Not on file    Non-medical: Not on file  Tobacco Use  . Smoking status: Never Smoker  . Smokeless tobacco: Never Used  Substance and Sexual Activity  . Alcohol use: Yes    Alcohol/week: 2.0 standard  drinks    Types: 2 Glasses of wine per week    Comment: occ  . Drug use: No  . Sexual activity: Yes    Partners: Male    Birth control/protection: Pill  Lifestyle  . Physical activity:    Days per week: Not on file    Minutes per session: Not on file  . Stress: Not on file  Relationships  . Social connections:    Talks on phone: Not on file    Gets together: Not on file    Attends religious service: Not on file    Active member of club or organization: Not on file    Attends meetings of clubs or organizations: Not on file    Relationship status: Not on file  . Intimate partner violence:    Fear of current or ex partner: Not on file    Emotionally abused: Not on file    Physically abused: Not on file    Forced sexual activity: Not on file  Other Topics Concern  . 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 Authorizing Provider  JUNEL FE 1/20 1-20 MG-MCG tablet TAKE 1 TABLET BY MOUTH EVERY DAY 11/23/16   Farrel Conners, CNM    Physical Exam Vitals:BP 130/80   Pulse 96   Ht 5\' 7"  (1.702 m)   Wt 168 lb (76.2 kg)   LMP 01/19/2018   BMI 26.31 kg/m  General:WF in  NAD HEENT: normocephalic, anicteric Neck: no thyroid enlargement, no palpable nodules, no cervical lymphadenopathy  Pulmonary: No increased work of breathing, CTAB Cardiovascular: RRR, without murmur  Breast: Breast symmetrical, no tenderness, no palpable nodules or masses, no skin or nipple retraction present, no nipple discharge.  No axillary, infraclavicular or supraclavicular lymphadenopathy. Abdomen: Soft, non-tender, non-distended.  Umbilicus without lesions.  No hepatomegaly or masses palpable. No evidence of hernia. Genitourinary:  External: Normal external female genitalia.  Normal urethral meatus, normal Bartholin's and Skene's  glands.    Vagina: Normal vaginal mucosa, no evidence of prolapse.    Cervix: Grossly normal in appearance, everted, parous, non-tender  Uterus: AV, normal size, shape, and consistency, mobile, and non-tender  Adnexa: No adnexal masses, non-tender  Rectal: deferred  Lymphatic: no evidence of inguinal lymphadenopathy Extremities: no edema, erythema, or tenderness Neurologic: Grossly intact Psychiatric: mood appropriate, affect full     Assessment: 47 y.o. [id number] normal annual gyn exam Hx of ASCUS PAP with negative HRHPV  Plan:   1) Breast cancer screening - recommend monthly self breast exam and annual screening mammograms. Mammogram was ordered today. Patient to schedule at [facility] after 03/12/18  2) Cervical cancer screening - Pap was done.  3) Contraception -Refills for Junel 1/20 x 1 year  4) Routine healthcare maintenance including cholesterol and diabetes screening drawn today   5) RTO for annual in 1 year  Farrel Conners, [place]

## 2018-02-05 LAB — HGB A1C W/O EAG: Hgb A1c MFr Bld: 5.1 % (ref 4.8–5.6)

## 2018-02-05 LAB — LIPID PANEL WITH LDL/HDL RATIO
Cholesterol, Total: 203 mg/dL — ABNORMAL HIGH (ref 100–199)
HDL: 69 mg/dL (ref >39)
LDL CALC: 110 mg/dL — AB (ref 0–99)
LDl/HDL Ratio: 1.6 ratio (ref 0.0–3.2)
Triglycerides: 121 mg/dL (ref 0–149)
VLDL CHOLESTEROL CAL: 24 mg/dL (ref 5–40)

## 2018-02-08 LAB — CYTOLOGY - PAP
DIAGNOSIS: NEGATIVE
HPV (WINDOPATH): NOT DETECTED

## 2018-02-09 ENCOUNTER — Encounter: Payer: Self-pay | Admitting: Certified Nurse Midwife

## 2018-03-30 ENCOUNTER — Telehealth: Payer: Self-pay

## 2018-03-30 NOTE — Telephone Encounter (Signed)
Pt saw [facility] last month.  Ck'd c [facility] yesterday and was told no refills of bcp on file.  [phone]  I called [facility] who confirmed pt does have refills.  Pt aware.

## 2018-03-31 DIAGNOSIS — S83512A Sprain of anterior cruciate ligament of left knee, initial encounter: Secondary | ICD-10-CM | POA: Diagnosis not present

## 2018-03-31 DIAGNOSIS — S8332XA Tear of articular cartilage of left knee, current, initial encounter: Secondary | ICD-10-CM | POA: Diagnosis not present

## 2018-03-31 DIAGNOSIS — S83242A Other tear of medial meniscus, current injury, left knee, initial encounter: Secondary | ICD-10-CM | POA: Diagnosis not present

## 2018-03-31 DIAGNOSIS — X58XXXA Exposure to other specified factors, initial encounter: Secondary | ICD-10-CM | POA: Diagnosis not present

## 2018-03-31 DIAGNOSIS — M25362 Other instability, left knee: Secondary | ICD-10-CM | POA: Diagnosis not present

## 2018-03-31 DIAGNOSIS — S83412A Sprain of medial collateral ligament of left knee, initial encounter: Secondary | ICD-10-CM | POA: Diagnosis not present

## 2018-03-31 DIAGNOSIS — X509XXA Other and unspecified overexertion or strenuous movements or postures, initial encounter: Secondary | ICD-10-CM | POA: Diagnosis not present

## 2018-03-31 DIAGNOSIS — M659 Synovitis and tenosynovitis, unspecified: Secondary | ICD-10-CM | POA: Diagnosis not present

## 2018-03-31 DIAGNOSIS — M25562 Pain in left knee: Secondary | ICD-10-CM | POA: Diagnosis not present

## 2018-04-15 DIAGNOSIS — X501XXA Overexertion from prolonged static or awkward postures, initial encounter: Secondary | ICD-10-CM | POA: Diagnosis not present

## 2018-04-15 DIAGNOSIS — S83512A Sprain of anterior cruciate ligament of left knee, initial encounter: Secondary | ICD-10-CM | POA: Diagnosis not present

## 2018-04-15 DIAGNOSIS — Y9239 Other specified sports and athletic area as the place of occurrence of the external cause: Secondary | ICD-10-CM | POA: Diagnosis not present

## 2018-04-15 DIAGNOSIS — M1712 Unilateral primary osteoarthritis, left knee: Secondary | ICD-10-CM | POA: Diagnosis not present

## 2018-04-20 ENCOUNTER — Ambulatory Visit
Admission: RE | Admit: 2018-04-20 | Discharge: 2018-04-20 | Disposition: A | Discharge status: Home / Self Care | Payer: BLUE CROSS/BLUE SHIELD | Source: Ambulatory Visit | Attending: Certified Nurse Midwife | Admitting: Certified Nurse Midwife

## 2018-04-20 DIAGNOSIS — Z1239 Encounter for other screening for malignant neoplasm of breast: Secondary | ICD-10-CM | POA: Insufficient documentation

## 2018-04-20 DIAGNOSIS — Z1231 Encounter for screening mammogram for malignant neoplasm of breast: Secondary | ICD-10-CM | POA: Diagnosis not present

## 2018-04-25 DIAGNOSIS — M25561 Pain in right knee: Secondary | ICD-10-CM | POA: Diagnosis not present

## 2018-04-25 DIAGNOSIS — M25562 Pain in left knee: Secondary | ICD-10-CM | POA: Diagnosis not present

## 2018-04-25 DIAGNOSIS — G8929 Other chronic pain: Secondary | ICD-10-CM | POA: Diagnosis not present

## 2018-06-14 DIAGNOSIS — M25562 Pain in left knee: Secondary | ICD-10-CM | POA: Diagnosis not present

## 2018-06-14 DIAGNOSIS — M25362 Other instability, left knee: Secondary | ICD-10-CM | POA: Diagnosis not present

## 2018-10-20 DIAGNOSIS — M170 Bilateral primary osteoarthritis of knee: Secondary | ICD-10-CM | POA: Diagnosis not present

## 2018-11-02 DIAGNOSIS — M1712 Unilateral primary osteoarthritis, left knee: Secondary | ICD-10-CM | POA: Diagnosis not present

## 2018-11-09 DIAGNOSIS — M1712 Unilateral primary osteoarthritis, left knee: Secondary | ICD-10-CM | POA: Diagnosis not present

## 2018-11-16 DIAGNOSIS — M1712 Unilateral primary osteoarthritis, left knee: Secondary | ICD-10-CM | POA: Diagnosis not present

## 2018-11-16 DIAGNOSIS — M25562 Pain in left knee: Secondary | ICD-10-CM | POA: Diagnosis not present

## 2018-11-16 DIAGNOSIS — G8929 Other chronic pain: Secondary | ICD-10-CM | POA: Diagnosis not present

## 2018-12-29 DIAGNOSIS — M1712 Unilateral primary osteoarthritis, left knee: Secondary | ICD-10-CM | POA: Diagnosis not present

## 2019-01-24 DIAGNOSIS — M1711 Unilateral primary osteoarthritis, right knee: Secondary | ICD-10-CM | POA: Diagnosis not present

## 2019-02-05 NOTE — Progress Notes (Signed)
Gynecology Annual Exam  PCP: Dalia Heading, CNM  Chief Complaint:  Chief Complaint  Patient presents with  . Gynecologic Exam    History of Present Illness:Marie Benson is a 48 year old Caucasian/White female , G 3 P 2 0 1 2 , who presents for her annual exam . She is not having any significant gyn problems. Her withdrawal bleeds are irregular on her Junel 1/20.  Occasionally has a light withdrawal bleed lasting 1 day. LMP was 9/29/20202. Denies dysmenorrhea. Has had occasional hot flashes She has had no intermenstrual spotting.   The patient's past medical history is notable for bilateral meniscus tears in her knees. Since her last annual GYN exam dated 01/07/2017 , she has torn her left ACL. Is getting nerve blocks done by Dr Sharlet Salina at [facility].  She is sexually active. She is currently using birth control pills for contraception.  Her most recent pap smear was obtained 02/04/2018 and it was NIL/negative HRHPV.  Remote history of positive HRHPV in 2006 with coloposcopy. Her most recent mammogram obtained on 04/20/2018 was Birads 1c. There is no family history of breast cancer.  There is no family history of ovarian cancer.  The patient does do monthly self breast exams.  The patient does not smoke.  The patient does drink 1-2 glasses of wine/ week The patient does not use illegal drugs.  The patient exercises regularly (spin classes 5x/week) The patient does get adequate calcium in her diet.  She had a cholesterol screen in 2019 that was normal   The patient denies current symptoms of depression.    Review of Systems: Review of Systems  Constitutional: Negative for chills, fever and weight loss.  HENT: Negative for congestion, sinus pain and sore throat.   Eyes: Negative for blurred vision and pain.  Respiratory: Negative for hemoptysis, shortness of breath and wheezing.   Cardiovascular: Negative for chest pain, palpitations and leg swelling.   Gastrointestinal: Negative for abdominal pain, blood in stool, diarrhea, heartburn, nausea and vomiting.  Genitourinary: Negative for dysuria, frequency, hematuria and urgency.  Musculoskeletal: Positive for joint pain (and swelling in both knees). Negative for back pain and myalgias.  Skin: Negative for itching and rash.  Neurological: Negative for dizziness, tingling and headaches.  Endo/Heme/Allergies: Negative for environmental allergies and polydipsia. Does not bruise/bleed easily.       Negative for hirsutism   Psychiatric/Behavioral: Negative for depression. The patient is not nervous/anxious and does not have insomnia.     Past Medical History:  Past Medical History:  Diagnosis Date  . Anemia   . Heel spur, left 02/2013  . History of abnormal cervical Pap smear 2006   positive HRHPV  . Tear of meniscus of left knee   . Tear of meniscus of right knee     Past Surgical History:  Past Surgical History:  Procedure Laterality Date  . COLPOSCOPY  2006  . DILATION AND CURETTAGE OF UTERUS  2008   missed abortion  . KNEE ARTHROSCOPY Left 04/29/2016  . KNEE SURGERY Right 08/01/2013   repair of meniscus    Family History:  Family History  Problem Relation Age of Onset  . Hypertension Mother   . Diabetes Sister   . Colon cancer Maternal Grandfather 37  . Bladder Cancer Maternal Aunt 83  . Breast cancer Neg Hx     Social History:  Social History   Socioeconomic History  . Marital status: Married    Spouse name: Not  on file  . Number of children: 2  . Years of education: 43  . Highest education level: Not on file  Occupational History  . Occupation: Futures trader  Social Needs  . Financial resource strain: Not on file  . Food insecurity    Worry: Not on file    Inability: Not on file  . Transportation needs    Medical: Not on file    Non-medical: Not on file  Tobacco Use  . Smoking status: Never Smoker  . Smokeless tobacco: Never Used  Substance and Sexual  Activity  . Alcohol use: Yes    Alcohol/week: 2.0 standard drinks    Types: 2 Glasses of wine per week    Comment: occ  . Drug use: No  . Sexual activity: Yes    Partners: Male    Birth control/protection: Pill  Lifestyle  . Physical activity    Days per week: Not on file    Minutes per session: Not on file  . Stress: Not on file  Relationships  . Social Musician on phone: Not on file    Gets together: Not on file    Attends religious service: Not on file    Active member of club or organization: Not on file    Attends meetings of clubs or organizations: Not on file    Relationship status: Not on file  . Intimate partner violence    Fear of current or ex partner: Not on file    Emotionally abused: Not on file    Physically abused: Not on file    Forced sexual activity: Not on file  Other Topics Concern  . 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 Authorizing Provider  JUNEL FE 1/20 1-20 MG-MCG tablet TAKE 1 TABLET BY MOUTH EVERY DAY 11/23/16   Farrel Conners, CNM   Occasionally takes fish oil, zinc, vitamin C, and Advil for knee pain.  Physical Exam Vitals:BP 128/76   Pulse 85   Ht 5\' 7"  (1.702 m)   Wt 177 lb (80.3 kg)   LMP 01/10/2019 (Approximate)   BMI 27.72 kg/m  General:WF in  NAD HEENT: normocephalic, anicteric Neck: no thyroid enlargement, no palpable nodules, no cervical lymphadenopathy  Pulmonary: No increased work of breathing, CTAB Cardiovascular: RRR, without murmur  Breast: Breast symmetrical, no skin or nipple retraction present, no nipple discharge.  No masses in the right breast. In the left breast there is a 1 cm mass, about 1-2 cm from the areola, that was tender with palpation. No axillary, infraclavicular or supraclavicular lymphadenopathy. Abdomen: Soft,  non-tender, non-distended.  Umbilicus without lesions.  No hepatomegaly or masses palpable. No evidence of hernia. Genitourinary:  External: Normal external female genitalia.  Normal urethral meatus, normal Bartholin's and Skene's glands.    Vagina: Normal vaginal mucosa, no evidence of prolapse.    Cervix: Grossly normal in appearance, everted, parous, non-tender  Uterus: AV, normal size, shape, and consistency, mobile, and non-tender  Adnexa: No adnexal masses, non-tender  Rectal: deferred  Lymphatic: no evidence of inguinal lymphadenopathy Extremities: no edema, erythema, or tenderness Neurologic: Grossly intact Psychiatric: mood appropriate, affect full     Assessment: 48 y.o. 51 annual gyn exam Left breast mass  Diagnostic left breast mammogram and ultrasound was ordered   Plan:   1)  Breast cancer screening - recommend monthly self breast exam and annual screening mammograms (bilateral mammogram was due 04/21/2019.   2) Cervical cancer screening - Pap was done and sent to [facility] lab  3) Contraception -Refills for Junel 1/20 x 1 year  4) Routine healthcare maintenance including cholesterol and diabetes screening UTD  5) RTO for annual in 1 year  Farrel Connersolleen Anissia Wessells, [place]

## 2019-02-06 ENCOUNTER — Telehealth: Payer: Self-pay | Admitting: Certified Nurse Midwife

## 2019-02-06 ENCOUNTER — Encounter: Payer: Self-pay | Admitting: Certified Nurse Midwife

## 2019-02-06 ENCOUNTER — Other Ambulatory Visit: Payer: Self-pay

## 2019-02-06 ENCOUNTER — Ambulatory Visit (INDEPENDENT_AMBULATORY_CARE_PROVIDER_SITE_OTHER): Payer: BC Managed Care – PPO | Admitting: Certified Nurse Midwife

## 2019-02-06 VITALS — BP 128/76 | HR 85 | Ht 67.0 in | Wt 177.0 lb

## 2019-02-06 DIAGNOSIS — Z01419 Encounter for gynecological examination (general) (routine) without abnormal findings: Secondary | ICD-10-CM

## 2019-02-06 DIAGNOSIS — Z124 Encounter for screening for malignant neoplasm of cervix: Secondary | ICD-10-CM

## 2019-02-06 DIAGNOSIS — N6320 Unspecified lump in the left breast, unspecified quadrant: Secondary | ICD-10-CM

## 2019-02-06 DIAGNOSIS — Z3041 Encounter for surveillance of contraceptive pills: Secondary | ICD-10-CM

## 2019-02-06 MED ORDER — NORETHIN ACE-ETH ESTRAD-FE 1-20 MG-MCG PO TABS: 1.0000 | ORAL_TABLET | Freq: Every day | ORAL | 3 refills | Status: DC

## 2019-02-06 NOTE — Telephone Encounter (Signed)
Called and spoke with patient. She is aware of her appointment time and date at [facility]. She is scheduled on Tuesday 02/14/2019 @ 11:00am.

## 2019-02-14 ENCOUNTER — Ambulatory Visit
Admission: RE | Admit: 2019-02-14 | Discharge: 2019-02-14 | Disposition: A | Discharge status: Home / Self Care | Payer: BC Managed Care – PPO | Source: Ambulatory Visit | Attending: Certified Nurse Midwife | Admitting: Certified Nurse Midwife

## 2019-02-14 DIAGNOSIS — N6320 Unspecified lump in the left breast, unspecified quadrant: Secondary | ICD-10-CM | POA: Insufficient documentation

## 2019-02-14 DIAGNOSIS — N6489 Other specified disorders of breast: Secondary | ICD-10-CM | POA: Diagnosis not present

## 2019-02-14 DIAGNOSIS — N6321 Unspecified lump in the left breast, upper outer quadrant: Secondary | ICD-10-CM | POA: Diagnosis not present

## 2019-02-14 DIAGNOSIS — R922 Inconclusive mammogram: Secondary | ICD-10-CM | POA: Diagnosis not present

## 2019-04-04 ENCOUNTER — Other Ambulatory Visit: Payer: Self-pay

## 2019-04-04 ENCOUNTER — Emergency Department: Payer: BC Managed Care – PPO

## 2019-04-04 ENCOUNTER — Encounter: Payer: Self-pay | Admitting: Emergency Medicine

## 2019-04-04 DIAGNOSIS — S52571A Other intraarticular fracture of lower end of right radius, initial encounter for closed fracture: Secondary | ICD-10-CM | POA: Diagnosis not present

## 2019-04-04 DIAGNOSIS — Z79899 Other long term (current) drug therapy: Secondary | ICD-10-CM | POA: Diagnosis not present

## 2019-04-04 DIAGNOSIS — W010XXA Fall on same level from slipping, tripping and stumbling without subsequent striking against object, initial encounter: Secondary | ICD-10-CM | POA: Insufficient documentation

## 2019-04-04 DIAGNOSIS — S52122A Displaced fracture of head of left radius, initial encounter for closed fracture: Secondary | ICD-10-CM | POA: Diagnosis not present

## 2019-04-04 DIAGNOSIS — Y939 Activity, unspecified: Secondary | ICD-10-CM | POA: Diagnosis not present

## 2019-04-04 DIAGNOSIS — Y999 Unspecified external cause status: Secondary | ICD-10-CM | POA: Diagnosis not present

## 2019-04-04 DIAGNOSIS — S52121A Displaced fracture of head of right radius, initial encounter for closed fracture: Secondary | ICD-10-CM | POA: Insufficient documentation

## 2019-04-04 DIAGNOSIS — Y929 Unspecified place or not applicable: Secondary | ICD-10-CM | POA: Insufficient documentation

## 2019-04-04 DIAGNOSIS — S59911A Unspecified injury of right forearm, initial encounter: Secondary | ICD-10-CM | POA: Diagnosis not present

## 2019-04-04 NOTE — ED Triage Notes (Signed)
Pt to triage via w/c with no distress noted, mask in place; reports tripped in BR injuring rt FA; skin W&D, brisk cap refill with good movement/sensation; no deformity noted, skin intact; denies any other c/o or injuries

## 2019-04-05 ENCOUNTER — Emergency Department
Admission: EM | Admit: 2019-04-05 | Discharge: 2019-04-05 | Disposition: A | Discharge status: Home / Self Care | Payer: BC Managed Care – PPO | Attending: Emergency Medicine | Admitting: Emergency Medicine

## 2019-04-05 DIAGNOSIS — S52121A Displaced fracture of head of right radius, initial encounter for closed fracture: Secondary | ICD-10-CM

## 2019-04-05 MED ORDER — TRAMADOL HCL 50 MG PO TABS: 50.0000 mg | ORAL_TABLET | Freq: Four times a day (QID) | ORAL | 0 refills | Status: DC | PRN

## 2019-04-05 MED ORDER — TRAMADOL HCL 50 MG PO TABS: 100.0000 mg | ORAL_TABLET | Freq: Once | ORAL | Status: DC

## 2019-04-05 NOTE — ED Provider Notes (Signed)
[facility] Emergency Department Provider Note  Time seen: 3:37 AM  I have reviewed the triage vital signs and the nursing notes.   HISTORY  Chief Complaint Arm Injury   HPI Marie Benson is a 48 y.o. female with no significant past medical history presents to the emergency department for right arm pain.  According to the patient she tripped falling forward landing on her right arm.  States immediate pain to the right arm/elbow.   Patient denies hitting her head, denies blood thinners.  Largely negative review of systems otherwise with no fever cough congestion shortness of breath.  Past Medical History:  Diagnosis Date  . Anemia   . Heel spur, left 02/2013  . History of abnormal cervical Pap smear 2006   positive HRHPV  . Tear of meniscus of left knee   . Tear of meniscus of right knee     Patient Active Problem List   Diagnosis Date Noted  . Contraceptive management 01/07/2017  . 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 COLPOSCOPY  2006  . DILATION AND CURETTAGE OF UTERUS  2008   missed abortion  . KNEE ARTHROSCOPY Left 04/29/2016  . KNEE SURGERY Right 08/01/2013   repair of meniscus    Prior to Admission medications   Medication Sig Start Date End Date Taking? Authorizing Provider  norethindrone-ethinyl estradiol (JUNEL FE 1/20) 1-20 MG-MCG tablet Take 1 tablet by mouth daily. 02/06/19   Dalia Heading, CNM    No Known Allergies  Family History  Problem Relation Age of Onset  . Hypertension Mother   . Diabetes Sister   . Colon cancer Maternal Grandfather 42  . Bladder Cancer Maternal Aunt 83  . Breast cancer Neg Hx     Social History Social History   Tobacco Use  . Smoking status: Never Smoker  . Smokeless tobacco: Never Used  Substance Use Topics  . Alcohol use: Yes    Alcohol/week: 2.0 standard drinks    Types: 2 Glasses of wine per week    Comment: occ  . Drug  use: No    Review of Systems Constitutional: Negative for fever. Cardiovascular: Negative for chest pain. Respiratory: Negative for shortness of breath. Gastrointestinal: Negative for abdominal pain Musculoskeletal: Right arm pain Skin: Negative for skin complaints  Neurological: Negative for headache All other ROS negative  ____________________________________________   PHYSICAL EXAM:  VITAL SIGNS: ED Triage Vitals  Enc Vitals Group     BP 04/04/19 2303 133/63     Pulse Rate 04/04/19 2303 96     Resp 04/04/19 2303 18     Temp 04/04/19 2303 98.6 F (37 C)     Temp Source 04/04/19 2303 Oral     SpO2 04/04/19 2303 98 %     Weight 04/04/19 2258 178 lb (80.7 kg)     Height 04/04/19 2258 5\' 6"  (1.676 m)     Head Circumference --      Peak Flow --      Pain Score 04/04/19 2258 10     Pain Loc --      Pain Edu? --      Excl. in [place]? --    Constitutional: Alert and oriented. Well appearing and in no distress. Eyes: Normal exam ENT      Head: Normocephalic and atraumatic.      Mouth/Throat: Mucous membranes are moist. Cardiovascular: Normal rate, regular rhythm.  Respiratory: Normal respiratory effort without tachypnea nor  retractions. Breath sounds are clear  Gastrointestinal: Soft and nontender. No distention.   Musculoskeletal: Pain to the right proximal forearm, no deformity noted.  Neurovascular intact distally. Neurologic:  Normal speech and language. No gross focal neurologic deficits Skin:  Skin is warm, dry and intact.  Psychiatric: Mood and affect are normal.  ____________________________________________    RADIOLOGY  Right radial head fracture  ____________________________________________   INITIAL IMPRESSION / ASSESSMENT AND PLAN / ED COURSE  Pertinent labs & imaging results that were available during my care of the patient were reviewed by me and considered in my medical decision making (see chart for details).   Patient presents to the emergency  department for right arm pain following a fall.  Patient is tender in the proximal forearm, x-ray consistent with radial head fracture including the articular surface.  No other injuries identified on exam patient otherwise appears well.  We will place the patient in a sugar tong splint and have the patient follow-up with orthopedics in 1 week for recheck/reevaluation.  Patient agreeable to plan of care.  Marie Benson was evaluated in Emergency Department on 04/05/2019 for the symptoms described in the history of present illness. She was evaluated in the context of the global COVID-19 pandemic, which necessitated consideration that the patient might be at risk for infection with the SARS-CoV-2 virus that causes COVID-19. Institutional protocols and algorithms that pertain to the evaluation of patients at risk for COVID-19 are in a state of rapid change based on information released by regulatory bodies including the CDC and federal and state organizations. These policies and algorithms were followed during the patient's care in the ED.  ____________________________________________   FINAL CLINICAL IMPRESSION(S) / ED DIAGNOSES  Right radial head fracture   Minna Antis, MD 04/05/19 [phone]

## 2019-04-11 DIAGNOSIS — M170 Bilateral primary osteoarthritis of knee: Secondary | ICD-10-CM | POA: Diagnosis not present

## 2019-04-11 DIAGNOSIS — S52124A Nondisplaced fracture of head of right radius, initial encounter for closed fracture: Secondary | ICD-10-CM | POA: Diagnosis not present

## 2019-04-20 ENCOUNTER — Other Ambulatory Visit: Payer: Self-pay | Admitting: Certified Nurse Midwife

## 2019-04-20 DIAGNOSIS — Z1231 Encounter for screening mammogram for malignant neoplasm of breast: Secondary | ICD-10-CM

## 2019-04-24 DIAGNOSIS — S52124D Nondisplaced fracture of head of right radius, subsequent encounter for closed fracture with routine healing: Secondary | ICD-10-CM | POA: Diagnosis not present

## 2019-04-26 DIAGNOSIS — M25521 Pain in right elbow: Secondary | ICD-10-CM | POA: Diagnosis not present

## 2019-04-26 DIAGNOSIS — M25621 Stiffness of right elbow, not elsewhere classified: Secondary | ICD-10-CM | POA: Diagnosis not present

## 2019-04-26 DIAGNOSIS — R600 Localized edema: Secondary | ICD-10-CM | POA: Diagnosis not present

## 2019-05-01 DIAGNOSIS — R600 Localized edema: Secondary | ICD-10-CM | POA: Diagnosis not present

## 2019-05-01 DIAGNOSIS — M25621 Stiffness of right elbow, not elsewhere classified: Secondary | ICD-10-CM | POA: Diagnosis not present

## 2019-05-01 DIAGNOSIS — M25521 Pain in right elbow: Secondary | ICD-10-CM | POA: Diagnosis not present

## 2019-05-09 DIAGNOSIS — M25621 Stiffness of right elbow, not elsewhere classified: Secondary | ICD-10-CM | POA: Diagnosis not present

## 2019-05-09 DIAGNOSIS — M25521 Pain in right elbow: Secondary | ICD-10-CM | POA: Diagnosis not present

## 2019-05-09 DIAGNOSIS — R600 Localized edema: Secondary | ICD-10-CM | POA: Diagnosis not present

## 2019-05-16 DIAGNOSIS — R600 Localized edema: Secondary | ICD-10-CM | POA: Diagnosis not present

## 2019-05-16 DIAGNOSIS — M25621 Stiffness of right elbow, not elsewhere classified: Secondary | ICD-10-CM | POA: Diagnosis not present

## 2019-05-16 DIAGNOSIS — M25521 Pain in right elbow: Secondary | ICD-10-CM | POA: Diagnosis not present

## 2019-05-22 ENCOUNTER — Ambulatory Visit
Admission: RE | Admit: 2019-05-22 | Discharge: 2019-05-22 | Disposition: A | Discharge status: Home / Self Care | Payer: BC Managed Care – PPO | Source: Ambulatory Visit | Attending: Certified Nurse Midwife | Admitting: Certified Nurse Midwife

## 2019-05-22 DIAGNOSIS — Z1231 Encounter for screening mammogram for malignant neoplasm of breast: Secondary | ICD-10-CM | POA: Diagnosis not present

## 2019-06-06 DIAGNOSIS — M25521 Pain in right elbow: Secondary | ICD-10-CM | POA: Diagnosis not present

## 2019-06-08 ENCOUNTER — Other Ambulatory Visit: Payer: Self-pay

## 2019-06-08 ENCOUNTER — Encounter: Payer: Self-pay | Admitting: Obstetrics and Gynecology

## 2019-06-08 ENCOUNTER — Ambulatory Visit (INDEPENDENT_AMBULATORY_CARE_PROVIDER_SITE_OTHER): Payer: BC Managed Care – PPO | Admitting: Obstetrics and Gynecology

## 2019-06-08 VITALS — BP 140/80 | Ht 67.0 in | Wt 183.0 lb

## 2019-06-08 DIAGNOSIS — Z1331 Encounter for screening for depression: Secondary | ICD-10-CM | POA: Diagnosis not present

## 2019-06-08 DIAGNOSIS — R451 Restlessness and agitation: Secondary | ICD-10-CM | POA: Diagnosis not present

## 2019-06-08 DIAGNOSIS — R6882 Decreased libido: Secondary | ICD-10-CM | POA: Diagnosis not present

## 2019-06-08 MED ORDER — CLONAZEPAM 0.5 MG PO TABS: 0.5000 mg | ORAL_TABLET | Freq: Every day | ORAL | 0 refills | Status: DC | PRN

## 2019-06-08 NOTE — Patient Instructions (Signed)
I value your feedback and entrusting us with your care. If you get a [name] patient survey, I would appreciate you taking the time to let us know about your experience today. Thank you!  As of March 23, 2019, your lab results will be released to your MyChart immediately, before I even have a chance to see them. Please give me time to review them and contact you if there are any abnormalities. Thank you for your patience.  

## 2019-06-08 NOTE — Progress Notes (Signed)
Farrel Conners, CNM   Chief Complaint  Patient presents with  . Menopause    sx like agigated, no sex drive    HPI:      [name] is a 49 y.o. [id number] who LMP was No LMP recorded. (Menstrual status: Oral contraceptives)., presents today for agitation, irritation, being snappy with kids, and decreased libido. Sx for past 1-1 1/2 yrs, but getting worse. No previous hx of anxiety/depression sx. Sx aren't daily but some days are just going to be bad. No depression sx. Not able to exercise for past yr due to bilat knee pain (getting sched for upcoming surg), but used to be very active. Also no libido. No dyspareunia/bleeding with sex. Has trouble staying asleep so takes ZQuil every night with sx relief.  Pt on OCPs, no bleeding on placebo wk. Above sx worse 3rd wk of pills.  Last annual 10/20  Past Medical History:  Diagnosis Date  . Anemia   . Heel spur, left 02/2013  . History of abnormal cervical Pap smear 2006   positive HRHPV  . Tear of meniscus of left knee   . Tear of meniscus of right knee     Past Surgical History:  Procedure Laterality Date  . COLPOSCOPY  2006  . DILATION AND CURETTAGE OF UTERUS  2008   missed abortion  . KNEE ARTHROSCOPY Left 04/29/2016  . KNEE SURGERY Right 08/01/2013   repair of meniscus    Family History  Problem Relation Age of Onset  . Hypertension Mother   . Diabetes Sister   . Colon cancer Maternal Grandfather 80  . Bladder Cancer Maternal Aunt 83  . Breast cancer Neg Hx     Social History   Socioeconomic History  . Marital status: Married    Spouse name: Not on file  . Number of children: 2  . Years of education: 24  . Highest education level: Not on file  Occupational History  . Occupation: Homemaker  Tobacco Use  . Smoking status: Never Smoker  . Smokeless tobacco: Never Used  Substance and Sexual Activity  . Alcohol use: Yes    Alcohol/week: 2.0 standard drinks    Types: 2 Glasses of wine per week   Comment: occ  . Drug use: No  . Sexual activity: Yes    Partners: Male    Birth control/protection: Pill  Other Topics Concern  . 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 Difficulty of Paying Living Expenses: Not on file  Food Insecurity:   . Worried About Programme researcher, broadcasting/film/video in the Last Year: Not on file  . Ran Out of Food in the Last Year: Not on file  Transportation Needs:   . Lack of Transportation (Medical): Not on file  . Lack of Transportation (Non-Medical): Not on file  Physical Activity:   . Days of Exercise per Week: Not on file  . Minutes of Exercise per Session: Not on file  Stress:   . Feeling of Stress : Not on file  Social Connections:   . Frequency of Communication with Friends and Family: Not on file  . Frequency of Social Gatherings with Friends and Family: Not on file  . Attends Religious Services: Not on file  . Active Member of Clubs or Organizations: Not on file  . Attends Banker Meetings: Not on file  .  Marital Status: Not on file  Intimate Partner Violence:   . Fear of Current or Ex-Partner: Not on file  . Emotionally Abused: Not on file  . Physically Abused: Not on file  . Sexually Abused: Not on file    Outpatient Medications Prior to Visit  Medication Sig Dispense Refill  . HYDROcodone-acetaminophen (NORCO/VICODIN) 5-325 MG tablet hydrocodone 5 mg-acetaminophen 325 mg tablet  Take 1 tablet every 4 hours by oral route as needed.    . norethindrone-ethinyl estradiol (JUNEL FE 1/20) 1-20 MG-MCG tablet Take 1 tablet by mouth daily. 84 tablet 3  . traMADol (ULTRAM) 50 MG tablet Take 1 tablet (50 mg total) by mouth every 6 (six) hours as needed. 12 tablet 0   No facility-administered medications prior to visit.      ROS:  Review of Systems  Constitutional: Negative for fatigue, fever and unexpected weight change.    Respiratory: Negative for cough, shortness of breath and wheezing.   Cardiovascular: Negative for chest pain, palpitations and leg swelling.  Gastrointestinal: Negative for blood in stool, constipation, diarrhea, nausea and vomiting.  Endocrine: Negative for cold intolerance, heat intolerance and polyuria.  Genitourinary: Negative for dyspareunia, dysuria, flank pain, frequency, genital sores, hematuria, menstrual problem, pelvic pain, urgency, vaginal bleeding, vaginal discharge and vaginal pain.  Musculoskeletal: Negative for back pain, joint swelling and myalgias.  Skin: Negative for rash.  Neurological: Negative for dizziness, syncope, light-headedness, numbness and headaches.  Hematological: Negative for adenopathy.  Psychiatric/Behavioral: Positive for agitation. Negative for confusion, sleep disturbance and suicidal ideas. The patient is not nervous/anxious.   BREAST: No symptoms   OBJECTIVE:   Vitals:  BP 140/80   Ht 5\' 7"  (1.702 m)   Wt 183 lb (83 kg)   BMI 28.66 kg/m   Physical Exam Vitals reviewed.  Constitutional:      Appearance: She is well-developed.  Pulmonary:     Effort: Pulmonary effort is normal.  Musculoskeletal:        General: Normal range of motion.     Cervical back: Normal range of motion.  Skin:    General: Skin is warm and dry.  Neurological:     General: No focal deficit present.     Mental Status: She is alert and oriented to person, place, and time.     Cranial Nerves: No cranial nerve deficit.  Psychiatric:        Mood and Affect: Mood normal.        Behavior: Behavior normal.        Thought Content: Thought content normal.        Judgment: Judgment normal.     Results: GAD 7 : Generalized Anxiety Score 06/08/2019  Nervous, Anxious, on Edge 2  Control/stop worrying 0  Worry too much - different things 0  Trouble relaxing 1  Restless 0  Easily annoyed or irritable 2  Afraid - awful might happen 0  Total GAD 7 Score 5  Anxiety  Difficulty Not difficult at all     Depression screen [facility] 2/9 06/08/2019  Decreased Interest 1  Down, Depressed, Hopeless 0  PHQ - 2 Score 1  Altered sleeping 1  Tired, decreased energy 1  Change in appetite 0  Feeling bad or failure about yourself  0  Trouble concentrating 0  Moving slowly or fidgety/restless 0  Suicidal thoughts 0  PHQ-9 Score 3  Difficult doing work/chores Not difficult at all     Assessment/Plan: Agitation - Plan: clonazePAM (KLONOPIN) 0.5 MG tablet; Minimal anxiety  on GAD, sx occas. Will try prn clonazepam, pt to take sparingly. Don't combine with zquil. Exercise once knees fixed. See if sx improve with prn dosing. Most likely not hormonal since she is on OCPs, but even if so, tx is with psych meds. F/u prn.   Decreased libido--can be related to OCPs, but pt not interested in stopping pills. May be related to agitation/a lot to do. Discussed testosterone is not tx, limited improvement with Addyi.    Meds ordered this encounter  Medications  . clonazePAM (KLONOPIN) 0.5 MG tablet    Sig: Take 1 tablet (0.5 mg total) by mouth daily as needed for anxiety.    Dispense:  30 tablet    Refill:  0    Order Specific Question:   Supervising Provider    Answer:   Nadara Mustard [[id number]]      Return if symptoms worsen or fail to improve.  Norabelle Kondo B. Esa Raden, PA-C 06/08/2019 3:33 PM

## 2019-07-12 ENCOUNTER — Other Ambulatory Visit: Payer: Self-pay | Admitting: Orthopedic Surgery

## 2019-07-21 DIAGNOSIS — M1712 Unilateral primary osteoarthritis, left knee: Secondary | ICD-10-CM | POA: Diagnosis not present

## 2019-07-24 DIAGNOSIS — M1712 Unilateral primary osteoarthritis, left knee: Secondary | ICD-10-CM | POA: Diagnosis not present

## 2019-07-24 DIAGNOSIS — Z01818 Encounter for other preprocedural examination: Secondary | ICD-10-CM | POA: Diagnosis not present

## 2019-07-24 DIAGNOSIS — Z01812 Encounter for preprocedural laboratory examination: Secondary | ICD-10-CM | POA: Diagnosis not present

## 2019-07-24 DIAGNOSIS — Z20822 Contact with and (suspected) exposure to covid-19: Secondary | ICD-10-CM | POA: Diagnosis not present

## 2019-07-26 DIAGNOSIS — G4700 Insomnia, unspecified: Secondary | ICD-10-CM | POA: Diagnosis not present

## 2019-07-26 DIAGNOSIS — Z96652 Presence of left artificial knee joint: Secondary | ICD-10-CM | POA: Diagnosis not present

## 2019-07-26 DIAGNOSIS — F419 Anxiety disorder, unspecified: Secondary | ICD-10-CM | POA: Diagnosis not present

## 2019-07-26 DIAGNOSIS — M25562 Pain in left knee: Secondary | ICD-10-CM | POA: Diagnosis not present

## 2019-07-26 DIAGNOSIS — M1712 Unilateral primary osteoarthritis, left knee: Secondary | ICD-10-CM | POA: Diagnosis not present

## 2019-07-26 DIAGNOSIS — Z793 Long term (current) use of hormonal contraceptives: Secondary | ICD-10-CM | POA: Diagnosis not present

## 2019-07-26 DIAGNOSIS — G8918 Other acute postprocedural pain: Secondary | ICD-10-CM | POA: Diagnosis not present

## 2019-07-27 DIAGNOSIS — G4700 Insomnia, unspecified: Secondary | ICD-10-CM | POA: Diagnosis not present

## 2019-07-27 DIAGNOSIS — M1712 Unilateral primary osteoarthritis, left knee: Secondary | ICD-10-CM | POA: Diagnosis not present

## 2019-07-27 DIAGNOSIS — Z793 Long term (current) use of hormonal contraceptives: Secondary | ICD-10-CM | POA: Diagnosis not present

## 2019-07-27 DIAGNOSIS — F419 Anxiety disorder, unspecified: Secondary | ICD-10-CM | POA: Diagnosis not present

## 2019-08-02 ENCOUNTER — Other Ambulatory Visit: Payer: Self-pay | Admitting: Physician Assistant

## 2019-08-02 ENCOUNTER — Other Ambulatory Visit (HOSPITAL_COMMUNITY): Payer: Self-pay | Admitting: Physician Assistant

## 2019-08-02 ENCOUNTER — Ambulatory Visit
Admission: RE | Admit: 2019-08-02 | Discharge: 2019-08-02 | Disposition: A | Discharge status: Home / Self Care | Payer: BC Managed Care – PPO | Source: Ambulatory Visit | Attending: Physician Assistant | Admitting: Physician Assistant

## 2019-08-02 ENCOUNTER — Other Ambulatory Visit: Payer: Self-pay

## 2019-08-02 DIAGNOSIS — M79605 Pain in left leg: Secondary | ICD-10-CM | POA: Diagnosis not present

## 2019-08-02 DIAGNOSIS — Z96652 Presence of left artificial knee joint: Secondary | ICD-10-CM | POA: Diagnosis not present

## 2019-08-02 DIAGNOSIS — R600 Localized edema: Secondary | ICD-10-CM | POA: Diagnosis not present

## 2019-08-02 DIAGNOSIS — R2242 Localized swelling, mass and lump, left lower limb: Secondary | ICD-10-CM | POA: Insufficient documentation

## 2019-08-04 DIAGNOSIS — Z96652 Presence of left artificial knee joint: Secondary | ICD-10-CM | POA: Diagnosis not present

## 2019-08-09 DIAGNOSIS — Z96652 Presence of left artificial knee joint: Secondary | ICD-10-CM | POA: Diagnosis not present

## 2019-08-11 DIAGNOSIS — Z96652 Presence of left artificial knee joint: Secondary | ICD-10-CM | POA: Diagnosis not present

## 2019-08-16 DIAGNOSIS — Z96652 Presence of left artificial knee joint: Secondary | ICD-10-CM | POA: Diagnosis not present

## 2019-08-18 DIAGNOSIS — Z96652 Presence of left artificial knee joint: Secondary | ICD-10-CM | POA: Diagnosis not present

## 2019-08-23 DIAGNOSIS — Z96652 Presence of left artificial knee joint: Secondary | ICD-10-CM | POA: Diagnosis not present

## 2019-08-28 DIAGNOSIS — Z96652 Presence of left artificial knee joint: Secondary | ICD-10-CM | POA: Diagnosis not present

## 2019-09-04 DIAGNOSIS — Z96652 Presence of left artificial knee joint: Secondary | ICD-10-CM | POA: Diagnosis not present

## 2019-12-25 DIAGNOSIS — K219 Gastro-esophageal reflux disease without esophagitis: Secondary | ICD-10-CM | POA: Diagnosis not present

## 2019-12-25 DIAGNOSIS — J301 Allergic rhinitis due to pollen: Secondary | ICD-10-CM | POA: Diagnosis not present

## 2020-01-02 DIAGNOSIS — M1711 Unilateral primary osteoarthritis, right knee: Secondary | ICD-10-CM | POA: Diagnosis not present

## 2020-02-15 ENCOUNTER — Encounter: Payer: Self-pay | Admitting: Obstetrics and Gynecology

## 2020-02-15 ENCOUNTER — Other Ambulatory Visit: Payer: Self-pay

## 2020-02-15 ENCOUNTER — Ambulatory Visit (INDEPENDENT_AMBULATORY_CARE_PROVIDER_SITE_OTHER): Payer: BC Managed Care – PPO | Admitting: Obstetrics and Gynecology

## 2020-02-15 VITALS — BP 118/80 | Ht 66.0 in | Wt 182.0 lb

## 2020-02-15 DIAGNOSIS — Z131 Encounter for screening for diabetes mellitus: Secondary | ICD-10-CM

## 2020-02-15 DIAGNOSIS — Z01419 Encounter for gynecological examination (general) (routine) without abnormal findings: Secondary | ICD-10-CM

## 2020-02-15 DIAGNOSIS — Z1211 Encounter for screening for malignant neoplasm of colon: Secondary | ICD-10-CM

## 2020-02-15 DIAGNOSIS — Z1231 Encounter for screening mammogram for malignant neoplasm of breast: Secondary | ICD-10-CM

## 2020-02-15 DIAGNOSIS — Z0000 Encounter for general adult medical examination without abnormal findings: Secondary | ICD-10-CM | POA: Diagnosis not present

## 2020-02-15 DIAGNOSIS — Z6829 Body mass index (BMI) 29.0-29.9, adult: Secondary | ICD-10-CM

## 2020-02-15 DIAGNOSIS — E785 Hyperlipidemia, unspecified: Secondary | ICD-10-CM | POA: Diagnosis not present

## 2020-02-15 DIAGNOSIS — Z3041 Encounter for surveillance of contraceptive pills: Secondary | ICD-10-CM

## 2020-02-15 DIAGNOSIS — Z13220 Encounter for screening for lipoid disorders: Secondary | ICD-10-CM | POA: Diagnosis not present

## 2020-02-15 MED ORDER — NORETHIN ACE-ETH ESTRAD-FE 1-20 MG-MCG PO TABS: 1.0000 | ORAL_TABLET | Freq: Every day | ORAL | 3 refills | Status: DC

## 2020-02-15 NOTE — Progress Notes (Signed)
PCP:  Farrel Conners, CNM (Inactive)   Chief Complaint  Patient presents with  . Gynecologic Exam     HPI:      [name] is a 49 y.o. [id number] whose LMP was No LMP recorded. (Menstrual status: Oral contraceptives)., presents today for her annual examination.  Her menses are absent with cont dosing OCPs. Dysmenorrhea none. She does not have intermenstrual bleeding.  Sex activity: single partner, contraception - OCP (estrogen/progesterone).  Last Pap: 01/17/19  Results were: no abnormalities /neg HPV DNA 2019 Hx of STDs: HPV with colpo in 2006  Seen for agitation/anxiety 2/21; given clonazepam prn but pt feels better. Hasn't taken med.  Also with decreased libido but just busy now and ok.   Last mammogram: 05/22/19  Results were: normal--routine follow-up in 12 months There is no FH of breast cancer. There is no FH of ovarian cancer. The patient does not do self-breast exams.  Tobacco use: The patient denies current or previous tobacco use. Alcohol use: none No drug use.  Exercise: moderately active; s/p LT knee replacement; has RT knee replacement sched for 12/21  Colonoscopy: never  She does get adequate calcium but not Vitamin D in her diet. Labs 2019, due for repeat today.  Past Medical History:  Diagnosis Date  . Anemia   . Heel spur, left 02/2013  . History of abnormal cervical Pap smear 2006   positive HRHPV  . Tear of meniscus of left knee   . Tear of meniscus of right knee     Past Surgical History:  Procedure Laterality Date  . COLPOSCOPY  2006  . DILATION AND CURETTAGE OF UTERUS  2008   missed abortion  . KNEE ARTHROSCOPY Left 04/29/2016  . KNEE SURGERY Right 08/01/2013   repair of meniscus    Family History  Problem Relation Age of Onset  . Hypertension Mother   . Diabetes Sister   . Colon cancer Maternal Grandfather 80  . Bladder Cancer Maternal Aunt 83  . Breast cancer Neg Hx     Social History   Socioeconomic History  .  Marital status: Married    Spouse name: Not on file  . Number of children: 2  . Years of education: 66  . Highest education level: Not on file  Occupational History  . Occupation: Homemaker  Tobacco Use  . Smoking status: Never Smoker  . Smokeless tobacco: Never Used  Vaping Use  . Vaping Use: Never used  Substance and Sexual Activity  . Alcohol use: Yes    Alcohol/week: 2.0 standard drinks    Types: 2 Glasses of wine per week    Comment: occ  . Drug use: No  . Sexual activity: Yes    Partners: Male    Birth control/protection: Pill  Other Topics Concern  . 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 Difficulty of Paying Living Expenses: Not on file  Food Insecurity:   . Worried About Programme researcher, broadcasting/film/video in the Last Year: Not on file  . Ran Out of Food in the Last Year: Not on file  Transportation Needs:   . Lack of Transportation (Medical): Not on file  . Lack of Transportation (Non-Medical): Not on file  Physical Activity:   . Days of Exercise per Week: Not on file  . Minutes of Exercise per Session: Not on file  Stress:   . Feeling of Stress : Not on file  Social Connections:   . Frequency of Communication with Friends and Family: Not on file  . Frequency of Social Gatherings with Friends and Family: Not on file  . Attends Religious Services: Not on file  . Active Member of Clubs or Organizations: Not on file  . Attends Banker Meetings: Not on file  . Marital Status: Not on file  Intimate Partner Violence:   . Fear of Current or Ex-Partner: Not on file  . Emotionally Abused: Not on file  . Physically Abused: Not on file  . Sexually Abused: Not on file     Current Outpatient Medications:  .  HYDROcodone-acetaminophen (NORCO/VICODIN) 5-325 MG tablet, hydrocodone 5 mg-acetaminophen 325 mg tablet  Take 1 tablet every 4 hours by oral route  as needed., Disp: , Rfl:  .  norethindrone-ethinyl estradiol (JUNEL FE 1/20) 1-20 MG-MCG tablet, Take 1 tablet by mouth daily., Disp: 84 tablet, Rfl: 3     ROS:  Review of Systems  Constitutional: Negative for fatigue, fever and unexpected weight change.  Respiratory: Negative for cough, shortness of breath and wheezing.   Cardiovascular: Negative for chest pain, palpitations and leg swelling.  Gastrointestinal: Negative for blood in stool, constipation, diarrhea, nausea and vomiting.  Endocrine: Negative for cold intolerance, heat intolerance and polyuria.  Genitourinary: Negative for dyspareunia, dysuria, flank pain, frequency, genital sores, hematuria, menstrual problem, pelvic pain, urgency, vaginal bleeding, vaginal discharge and vaginal pain.  Musculoskeletal: Positive for arthralgias. Negative for back pain, joint swelling and myalgias.  Skin: Negative for rash.  Neurological: Negative for dizziness, syncope, light-headedness, numbness and headaches.  Hematological: Negative for adenopathy.  Psychiatric/Behavioral: Negative for agitation, confusion, sleep disturbance and suicidal ideas. The patient is not nervous/anxious.   BREAST: No symptoms   Objective: BP 118/80   Ht 5\' 6"  (1.676 m)   Wt 182 lb (82.6 kg)   BMI 29.38 kg/m    Physical Exam Constitutional:      Appearance: She is well-developed.  Genitourinary:     Vulva, vagina, cervix, uterus, right adnexa and left adnexa normal.     No vulval lesion or tenderness noted.     No vaginal discharge, erythema or tenderness.     No cervical polyp.     Uterus is not enlarged or tender.     No right or left adnexal mass present.     Right adnexa not tender.     Left adnexa not tender.  Neck:     Thyroid: No thyromegaly.  Cardiovascular:     Rate and Rhythm: Normal rate and regular rhythm.     Heart sounds: Normal heart sounds. No murmur heard.   Pulmonary:     Effort: Pulmonary effort is normal.     Breath  sounds: Normal breath sounds.  Chest:     Breasts:        Right: No mass, nipple discharge, skin change or tenderness.        Left: No mass, nipple discharge, skin change or tenderness.  Abdominal:     Palpations: Abdomen is soft.     Tenderness: There is no abdominal tenderness. There is no guarding.  Musculoskeletal:        General: Normal range of motion.     Cervical back: Normal range of motion.  Neurological:     General: No focal deficit present.     Mental Status: She is alert and oriented to person, place, and  time.     Cranial Nerves: No cranial nerve deficit.  Skin:    General: Skin is warm and dry.  Psychiatric:        Mood and Affect: Mood normal.        Behavior: Behavior normal.        Thought Content: Thought content normal.        Judgment: Judgment normal.  Vitals reviewed.     Assessment/Plan: Encounter for annual routine gynecological examination  Encounter for surveillance of contraceptive pills - Plan: norethindrone-ethinyl estradiol (JUNEL FE 1/20) 1-20 MG-MCG tablet; OCP RF to mailorder.   Encounter for screening mammogram for malignant neoplasm of breast - Plan: MM 3D SCREEN BREAST BILATERAL, pt to sched mammo  Screening for colon cancer--colonoscopy/cologuard discussed. Pt declines either for now. F/u prn  Blood tests for routine general physical examination - Plan: Comprehensive metabolic panel, Lipid panel, Hemoglobin A1c  Screening cholesterol level - Plan: Lipid panel  Screening for diabetes mellitus - Plan: Hemoglobin A1c  BMI 29.0-29.9,adult - Plan: Comprehensive metabolic panel, Lipid panel, Hemoglobin A1c  Meds ordered this encounter  Medications  . norethindrone-ethinyl estradiol (JUNEL FE 1/20) 1-20 MG-MCG tablet    Sig: Take 1 tablet by mouth daily.    Dispense:  84 tablet    Refill:  3    Order Specific Question:   Supervising Provider    Answer:   Nadara Mustard [[id number]]             GYN counsel breast self exam, mammography  screening, adequate intake of calcium and vitamin D, diet and exercise     F/U  Return in about 1 year (around 02/14/2021).  Lavender Stanke B. Lacey Wallman, PA-C 02/15/2020 12:02 PM

## 2020-02-15 NOTE — Patient Instructions (Addendum)
I value your feedback and entrusting us with your care. If you get a [facility] patient survey, I would appreciate you taking the time to let us know about your experience today. Thank you! ° °As of March 23, 2019, your lab results will be released to your MyChart immediately, before I even have a chance to see them. Please give me time to review them and contact you if there are any abnormalities. Thank you for your patience.  ° °[facility]: [phone] ° ° ° °

## 2020-02-16 LAB — COMPREHENSIVE METABOLIC PANEL
ALT: 12 IU/L (ref 0–32)
AST: 16 IU/L (ref 0–40)
Albumin/Globulin Ratio: 1.5 (ref 1.2–2.2)
Albumin: 4.4 g/dL (ref 3.8–4.8)
Alkaline Phosphatase: 95 IU/L (ref 44–121)
BUN/Creatinine Ratio: 16 (ref 9–23)
BUN: 11 mg/dL (ref 6–24)
Bilirubin Total: 0.4 mg/dL (ref 0.0–1.2)
CO2: 20 mmol/L (ref 20–29)
Calcium: 9.5 mg/dL (ref 8.7–10.2)
Chloride: 100 mmol/L (ref 96–106)
Creatinine, Ser: 0.69 mg/dL (ref 0.57–1.00)
GFR calc Af Amer: 120 mL/min/{1.73_m2} (ref >59)
GFR calc non Af Amer: 104 mL/min/{1.73_m2} (ref >59)
Globulin, Total: 2.9 g/dL (ref 1.5–4.5)
Glucose: 98 mg/dL (ref 65–99)
Potassium: 3.9 mmol/L (ref 3.5–5.2)
Sodium: 141 mmol/L (ref 134–144)
Total Protein: 7.3 g/dL (ref 6.0–8.5)

## 2020-02-16 LAB — LIPID PANEL
Chol/HDL Ratio: 3.4 ratio (ref 0.0–4.4)
Cholesterol, Total: 203 mg/dL — ABNORMAL HIGH (ref 100–199)
HDL: 60 mg/dL (ref >39)
LDL Chol Calc (NIH): 121 mg/dL — ABNORMAL HIGH (ref 0–99)
Triglycerides: 122 mg/dL (ref 0–149)
VLDL Cholesterol Cal: 22 mg/dL (ref 5–40)

## 2020-02-16 LAB — HEMOGLOBIN A1C
Est. average glucose Bld gHb Est-mCnc: 105 mg/dL
Hgb A1c MFr Bld: 5.3 % (ref 4.8–5.6)

## 2020-02-16 NOTE — Progress Notes (Signed)
Pls let pt know labs normal and lipid values. LDL slightly borderline but ok overall. Thx.

## 2020-02-19 NOTE — Progress Notes (Signed)
Called pt, no answer, LVMTRC. 

## 2020-02-27 DIAGNOSIS — Z01818 Encounter for other preprocedural examination: Secondary | ICD-10-CM | POA: Diagnosis not present

## 2020-02-27 DIAGNOSIS — M25561 Pain in right knee: Secondary | ICD-10-CM | POA: Diagnosis not present

## 2020-02-27 DIAGNOSIS — M25661 Stiffness of right knee, not elsewhere classified: Secondary | ICD-10-CM | POA: Diagnosis not present

## 2020-02-29 ENCOUNTER — Telehealth: Payer: Self-pay | Admitting: Obstetrics and Gynecology

## 2020-02-29 DIAGNOSIS — Z3041 Encounter for surveillance of contraceptive pills: Secondary | ICD-10-CM

## 2020-02-29 MED ORDER — NORETHIN ACE-ETH ESTRAD-FE 1-20 MG-MCG PO TABS: 1.0000 | ORAL_TABLET | Freq: Every day | ORAL | 0 refills | Status: DC

## 2020-02-29 NOTE — Telephone Encounter (Signed)
Patient was seen here on 02/15/20 for annual. Patient is reporting there is a problem with her prescription and needs someone to correct the entry. Patient is also requesting a refill sent to [facility] [address] so she has her birth control. Please advise

## 2020-02-29 NOTE — Telephone Encounter (Signed)
Pt says Rx never made it to her insurance's mail order pharmacy. Will look into it to figure it out. 3 month supply sent to local pharmacy in the meantime.

## 2020-03-04 ENCOUNTER — Other Ambulatory Visit: Payer: Self-pay

## 2020-03-04 DIAGNOSIS — Z3041 Encounter for surveillance of contraceptive pills: Secondary | ICD-10-CM

## 2020-03-04 MED ORDER — NORETHIN ACE-ETH ESTRAD-FE 1-20 MG-MCG PO TABS: 1.0000 | ORAL_TABLET | Freq: Every day | ORAL | 2 refills | Status: DC

## 2020-03-04 NOTE — Telephone Encounter (Signed)
Called [employer] # on ins card. Member ID # is not showing active/in system. Called pt to let her know, she asks that we send [facility] Rx to local pharmacy as she went there and pick up 3 month supply without any issues. Also asked for lab results and they were given.

## 2020-03-14 DIAGNOSIS — G8918 Other acute postprocedural pain: Secondary | ICD-10-CM | POA: Diagnosis not present

## 2020-03-14 DIAGNOSIS — Z96652 Presence of left artificial knee joint: Secondary | ICD-10-CM | POA: Diagnosis not present

## 2020-03-14 DIAGNOSIS — M25761 Osteophyte, right knee: Secondary | ICD-10-CM | POA: Diagnosis not present

## 2020-03-14 DIAGNOSIS — M1711 Unilateral primary osteoarthritis, right knee: Secondary | ICD-10-CM | POA: Diagnosis not present

## 2020-03-14 DIAGNOSIS — M25561 Pain in right knee: Secondary | ICD-10-CM | POA: Diagnosis not present

## 2020-03-15 DIAGNOSIS — M1711 Unilateral primary osteoarthritis, right knee: Secondary | ICD-10-CM | POA: Diagnosis not present

## 2020-03-15 DIAGNOSIS — Z96652 Presence of left artificial knee joint: Secondary | ICD-10-CM | POA: Diagnosis not present

## 2020-03-15 DIAGNOSIS — M25761 Osteophyte, right knee: Secondary | ICD-10-CM | POA: Diagnosis not present

## 2020-03-21 DIAGNOSIS — Z96659 Presence of unspecified artificial knee joint: Secondary | ICD-10-CM | POA: Diagnosis not present

## 2020-03-21 DIAGNOSIS — M25661 Stiffness of right knee, not elsewhere classified: Secondary | ICD-10-CM | POA: Diagnosis not present

## 2020-03-21 DIAGNOSIS — M25561 Pain in right knee: Secondary | ICD-10-CM | POA: Diagnosis not present

## 2020-03-25 DIAGNOSIS — Z96659 Presence of unspecified artificial knee joint: Secondary | ICD-10-CM | POA: Diagnosis not present

## 2020-03-25 DIAGNOSIS — M25661 Stiffness of right knee, not elsewhere classified: Secondary | ICD-10-CM | POA: Diagnosis not present

## 2020-03-25 DIAGNOSIS — M25561 Pain in right knee: Secondary | ICD-10-CM | POA: Diagnosis not present

## 2020-03-27 DIAGNOSIS — M25561 Pain in right knee: Secondary | ICD-10-CM | POA: Diagnosis not present

## 2020-03-27 DIAGNOSIS — M25661 Stiffness of right knee, not elsewhere classified: Secondary | ICD-10-CM | POA: Diagnosis not present

## 2020-03-27 DIAGNOSIS — Z96659 Presence of unspecified artificial knee joint: Secondary | ICD-10-CM | POA: Diagnosis not present

## 2020-04-01 DIAGNOSIS — M25661 Stiffness of right knee, not elsewhere classified: Secondary | ICD-10-CM | POA: Diagnosis not present

## 2020-04-01 DIAGNOSIS — M25561 Pain in right knee: Secondary | ICD-10-CM | POA: Diagnosis not present

## 2020-04-01 DIAGNOSIS — Z96659 Presence of unspecified artificial knee joint: Secondary | ICD-10-CM | POA: Diagnosis not present

## 2020-04-02 ENCOUNTER — Encounter: Payer: Self-pay | Admitting: Obstetrics and Gynecology

## 2020-04-03 DIAGNOSIS — M25561 Pain in right knee: Secondary | ICD-10-CM | POA: Diagnosis not present

## 2020-04-03 DIAGNOSIS — M25661 Stiffness of right knee, not elsewhere classified: Secondary | ICD-10-CM | POA: Diagnosis not present

## 2020-04-03 DIAGNOSIS — Z96659 Presence of unspecified artificial knee joint: Secondary | ICD-10-CM | POA: Diagnosis not present

## 2020-04-15 DIAGNOSIS — M25661 Stiffness of right knee, not elsewhere classified: Secondary | ICD-10-CM | POA: Diagnosis not present

## 2020-04-15 DIAGNOSIS — M25561 Pain in right knee: Secondary | ICD-10-CM | POA: Diagnosis not present

## 2020-04-15 DIAGNOSIS — Z96659 Presence of unspecified artificial knee joint: Secondary | ICD-10-CM | POA: Diagnosis not present

## 2020-04-22 DIAGNOSIS — M25661 Stiffness of right knee, not elsewhere classified: Secondary | ICD-10-CM | POA: Diagnosis not present

## 2020-04-22 DIAGNOSIS — M25561 Pain in right knee: Secondary | ICD-10-CM | POA: Diagnosis not present

## 2020-04-22 DIAGNOSIS — Z96659 Presence of unspecified artificial knee joint: Secondary | ICD-10-CM | POA: Diagnosis not present

## 2020-04-23 DIAGNOSIS — Z96651 Presence of right artificial knee joint: Secondary | ICD-10-CM | POA: Diagnosis not present

## 2020-05-29 ENCOUNTER — Ambulatory Visit
Admission: RE | Admit: 2020-05-29 | Discharge: 2020-05-29 | Disposition: A | Discharge status: Home / Self Care | Payer: BC Managed Care – PPO | Source: Ambulatory Visit | Attending: Obstetrics and Gynecology | Admitting: Obstetrics and Gynecology

## 2020-05-29 ENCOUNTER — Other Ambulatory Visit: Payer: Self-pay

## 2020-05-29 DIAGNOSIS — Z1231 Encounter for screening mammogram for malignant neoplasm of breast: Secondary | ICD-10-CM | POA: Diagnosis not present

## 2020-06-03 ENCOUNTER — Encounter: Payer: Self-pay | Admitting: Obstetrics and Gynecology

## 2020-07-02 ENCOUNTER — Ambulatory Visit: Payer: BC Managed Care – PPO | Admitting: Dermatology

## 2020-07-23 ENCOUNTER — Other Ambulatory Visit: Payer: Self-pay

## 2020-07-23 ENCOUNTER — Ambulatory Visit
Admission: EM | Admit: 2020-07-23 | Discharge: 2020-07-23 | Disposition: A | Discharge status: Home / Self Care | Payer: BC Managed Care – PPO | Attending: Emergency Medicine | Admitting: Emergency Medicine

## 2020-07-23 ENCOUNTER — Ambulatory Visit (INDEPENDENT_AMBULATORY_CARE_PROVIDER_SITE_OTHER): Payer: Self-pay | Admitting: Dermatology

## 2020-07-23 ENCOUNTER — Encounter: Payer: Self-pay | Admitting: Emergency Medicine

## 2020-07-23 ENCOUNTER — Encounter: Payer: Self-pay | Admitting: Dermatology

## 2020-07-23 DIAGNOSIS — L988 Other specified disorders of the skin and subcutaneous tissue: Secondary | ICD-10-CM

## 2020-07-23 DIAGNOSIS — H6692 Otitis media, unspecified, left ear: Secondary | ICD-10-CM

## 2020-07-23 MED ORDER — AMOXICILLIN 875 MG PO TABS: 875.0000 mg | ORAL_TABLET | Freq: Two times a day (BID) | ORAL | 0 refills | Status: AC

## 2020-07-23 NOTE — ED Provider Notes (Signed)
Renaldo Fiddler    CSN: [id number] Arrival date & time: 07/23/20  1424      History   Chief Complaint Chief Complaint  Patient presents with  . Otalgia    HPI Marie Benson is a 50 y.o. female.   Patient presents with 2-week history of left ear pain.  No trauma.  She reports muffled hearing.  She denies fever, chills, drainage, sore throat, cough, shortness of breath, or other symptoms.  Treatment attempted at home with Ciprodex eardrops without relief.  Her medical history includes anemia.  The history is provided by the patient and medical records.    Past Medical History:  Diagnosis Date  . Anemia   . Elevated lipids   . Heel spur, left 02/2013  . History of abnormal cervical Pap smear 2006   positive HRHPV  . Tear of meniscus of left knee   . Tear of meniscus of right knee     Patient Active Problem List   Diagnosis Date Noted  . Contraceptive management 01/07/2017  . 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 COLPOSCOPY  2006  . DILATION AND CURETTAGE OF UTERUS  2008   missed abortion  . KNEE ARTHROSCOPY Left 04/29/2016  . KNEE SURGERY Right 08/01/2013   repair of meniscus    OB History    Gravida  3   Para  2   Term  2   Preterm      AB  1   Living  2     SAB  1   IAB      Ectopic      Multiple      Live Births  2            Home Medications    Prior to Admission medications   Medication Sig Start Date End Date Taking? Authorizing Provider  amoxicillin (AMOXIL) 875 MG tablet Take 1 tablet (875 mg total) by mouth 2 (two) times daily for 7 days. 07/23/20 07/30/20 Yes Mickie Bail, NP  norethindrone-ethinyl estradiol (JUNEL FE 1/20) 1-20 MG-MCG tablet Take 1 tablet by mouth daily. 03/04/20  Yes Copland, Ilona Sorrel, PA-C  HYDROcodone-acetaminophen (NORCO/VICODIN) 5-325 MG tablet hydrocodone 5 mg-acetaminophen 325 mg tablet  Take 1 tablet every 4 hours by oral route as  needed.    [provider]    Family History Family History  Problem Relation Age of Onset  . Hypertension Mother   . Diabetes Sister   . Colon cancer Maternal Grandfather 80  . Bladder Cancer Maternal Aunt 83  . Breast cancer Neg Hx     Social History Social History   Tobacco Use  . Smoking status: Never Smoker  . Smokeless tobacco: Never Used  Vaping Use  . Vaping Use: Never used  Substance Use Topics  . Alcohol use: Yes    Alcohol/week: 2.0 standard drinks    Types: 2 Glasses of wine per week    Comment: occ  . Drug use: No     Allergies   Patient has no known allergies.   Review of Systems Review of Systems  Constitutional: Negative for chills and fever.  HENT: Positive for ear pain. Negative for congestion, rhinorrhea and sore throat.   Eyes: Negative for pain and visual disturbance.  Respiratory: Negative for cough and shortness of breath.   Cardiovascular: Negative for chest pain and palpitations.  Gastrointestinal: Negative for abdominal pain, diarrhea and vomiting.  Genitourinary: Negative  for dysuria and hematuria.  Musculoskeletal: Negative for arthralgias and back pain.  Skin: Negative for color change and rash.  Neurological: Negative for seizures and syncope.  All other systems reviewed and are negative.    Physical Exam Triage Vital Signs ED Triage Vitals  Enc Vitals Group     BP      Pulse      Resp      Temp      Temp src      SpO2      Weight      Height      Head Circumference      Peak Flow      Pain Score      Pain Loc      Pain Edu?      Excl. in GC?    No data found.  Updated Vital Signs BP (!) 145/79 (BP Location: Left Arm)   Pulse 87   Temp 98.3 F (36.8 C) (Oral)   Resp 18   LMP  (LMP Unknown)   SpO2 99%   Visual Acuity Right Eye Distance:   Left Eye Distance:   Bilateral Distance:    Right Eye Near:   Left Eye Near:    Bilateral Near:     Physical Exam Vitals and nursing note reviewed.   Constitutional:      General: She is not in acute distress.    Appearance: She is well-developed. She is not ill-appearing.  HENT:     Head: Normocephalic and atraumatic.     Right Ear: Tympanic membrane and ear canal normal.     Left Ear: Ear canal normal. Tympanic membrane is erythematous.     Nose: Nose normal.     Mouth/Throat:     Mouth: Mucous membranes are moist.     Pharynx: Oropharynx is clear.  Eyes:     Conjunctiva/sclera: Conjunctivae normal.  Cardiovascular:     Rate and Rhythm: Normal rate and regular rhythm.     Heart sounds: Normal heart sounds.  Pulmonary:     Effort: Pulmonary effort is normal. No respiratory distress.     Breath sounds: Normal breath sounds.  Abdominal:     Palpations: Abdomen is soft.     Tenderness: There is no abdominal tenderness.  Musculoskeletal:     Cervical back: Neck supple.  Skin:    General: Skin is warm and dry.  Neurological:     General: No focal deficit present.     Mental Status: She is alert and oriented to person, place, and time.  Psychiatric:        Mood and Affect: Mood normal.        Behavior: Behavior normal.      UC Treatments / Results  Labs (all labs ordered are listed, but only abnormal results are displayed) Labs Reviewed - No data to display  EKG   Radiology Botox Injection  Result Date: 07/23/2020 Location: See attached image Informed consent: Discussed risks (infection, pain, bleeding, bruising, swelling, allergic reaction, paralysis of nearby muscles, eyelid droop, double vision, neck weakness, difficulty breathing, headache, undesirable cosmetic result, and need for additional treatment) and benefits of the procedure, as well as the alternatives.  Informed consent was obtained. Preparation: The area was cleansed with alcohol. Procedure Details:  Botox was injected into the dermis with a 30-gauge needle. Pressure applied to any bleeding. Ice packs offered for swelling. Lot Number:  [id number]  Expiration:  09/2022 Total Units Injected:  50 Plan: Patient  was instructed to remain upright for 4 hours. Patient was instructed to avoid massaging the face and avoid vigorous exercise for the rest of the day. Tylenol may be used for headache.  Allow 2 weeks before returning to clinic for additional dosing as needed. Patient will call for any problems.    Procedures Procedures (including critical care time)  Medications Ordered in UC Medications - No data to display  Initial Impression / Assessment and Plan / UC Course  I have reviewed the triage vital signs and the nursing notes.  Pertinent labs & imaging results that were available during my care of the patient were reviewed by me and considered in my medical decision making (see chart for details).   Left otitis media.  Treating with amoxicillin.  Instructed patient to take Tylenol or ibuprofen as needed for discomfort.  Discussed that she should follow-up with her PCP if her symptoms are not improving.  She agrees to plan of care.   Final Clinical Impressions(s) / UC Diagnoses   Final diagnoses:  Left otitis media, unspecified otitis media type     Discharge Instructions     Take the amoxicillin as directed.    Follow up with your primary care provider if your symptoms are not improving.        ED Prescriptions    Medication Sig Dispense Auth. Provider   amoxicillin (AMOXIL) 875 MG tablet Take 1 tablet (875 mg total) by mouth 2 (two) times daily for 7 days. 14 tablet Mickie Bail, NP     PDMP not reviewed this encounter.   Mickie Bail, NP 07/23/20 1456

## 2020-07-23 NOTE — Discharge Instructions (Addendum)
Take the amoxicillin as directed.  Follow up with your primary care provider if your symptoms are not improving.   ° ° °

## 2020-07-23 NOTE — Patient Instructions (Signed)

## 2020-07-23 NOTE — Progress Notes (Signed)
   New Patient Visit  Subjective  Marie Benson is a 50 y.o. female who presents for the following: Facial Elastosis (Botox today).  The following portions of the chart were reviewed this encounter and updated as appropriate:   Tobacco  Allergies  Meds  Problems  Med Hx  Surg Hx  Fam Hx     Review of Systems:  No other skin or systemic complaints except as noted in HPI or Assessment and Plan.  Objective  Well appearing patient in no apparent distress; mood and affect are within normal limits.  A focused examination was performed including face. Relevant physical exam findings are noted in the Assessment and Plan.  Objective  Head - Anterior (Face): Rhytides and volume loss.   Images                 Assessment & Plan  Elastosis of skin Head - Anterior (Face)  Botox today -   25 units to frown complex 10 units to forehead 7.5 units each to crow's feet  Botox Injection - Head - Anterior (Face) Location: See attached image  Informed consent: Discussed risks (infection, pain, bleeding, bruising, swelling, allergic reaction, paralysis of nearby muscles, eyelid droop, double vision, neck weakness, difficulty breathing, headache, undesirable cosmetic result, and need for additional treatment) and benefits of the procedure, as well as the alternatives.  Informed consent was obtained.  Preparation: The area was cleansed with alcohol.  Procedure Details:  Botox was injected into the dermis with a 30-gauge needle. Pressure applied to any bleeding. Ice packs offered for swelling.  Lot Number:  [id number] Expiration:  09/2022  Total Units Injected:  50  Plan: Patient was instructed to remain upright for 4 hours. Patient was instructed to avoid massaging the face and avoid vigorous exercise for the rest of the day. Tylenol may be used for headache.  Allow 2 weeks before returning to clinic for additional dosing as needed. Patient will call for any  problems.   Return in about 1 month (around 08/22/2020) for Botox follow up.  I, Joanie Coddington, CMA, am acting as scribe for Armida Sans, MD .  Documentation: I have reviewed the above documentation for accuracy and completeness, and I agree with the above.  Armida Sans, MD

## 2020-07-23 NOTE — ED Triage Notes (Signed)
Patient c/o LFT ear pain x 2 weeks.   Patient states " I have a jabbing pain in ear that worseness at night".   Patient denies ringing in ear and drainage.   Patient endorses decreased hearing out of LFT ear.   Patient denies nasal congestion and headache.   Patient tried ciprodex with no relief of symptoms.

## 2020-08-06 ENCOUNTER — Encounter: Payer: Self-pay | Admitting: Emergency Medicine

## 2020-08-06 ENCOUNTER — Other Ambulatory Visit: Payer: Self-pay

## 2020-08-06 ENCOUNTER — Ambulatory Visit
Admission: EM | Admit: 2020-08-06 | Discharge: 2020-08-06 | Disposition: A | Discharge status: Home / Self Care | Payer: BC Managed Care – PPO | Attending: Emergency Medicine | Admitting: Emergency Medicine

## 2020-08-06 DIAGNOSIS — H6692 Otitis media, unspecified, left ear: Secondary | ICD-10-CM | POA: Diagnosis not present

## 2020-08-06 MED ORDER — AZITHROMYCIN 250 MG PO TABS: 250.0000 mg | ORAL_TABLET | Freq: Every day | ORAL | 0 refills | Status: DC

## 2020-08-06 NOTE — ED Triage Notes (Signed)
Patient c/o LFT ear pain x  1 month.   Patient states " I still feel the jabbing in my ear but not as bad".   Patient denies ringing or changes in hearing.   Patient is flying tomorrow and is concerned about traveling with ear pain.   Patient was previously seen at this clinic.   Patient was given Amoxicillin and has finished prescription.

## 2020-08-06 NOTE — Discharge Instructions (Signed)
Take the Zithromax as directed.    Follow-up with your primary care provider or ENT if your symptoms are not improving.

## 2020-08-06 NOTE — ED Provider Notes (Signed)
[facility]    CSN: [id number] Arrival date & time: 08/06/20  1412      History   Chief Complaint Chief Complaint  Patient presents with  . Otalgia    HPI Marie Benson is a 50 y.o. female.   Patient was seen here on 07/23/2020; diagnosed with left otitis media; treated with amoxicillin.  She states she is still having left ear pain; it has improved slightly.  She is flying to [place] tomorrow and is concerned about the ongoing ear pain.  She denies fever, chills, sore throat, cough, shortness of breath, vomiting, diarrhea, or other symptoms.  Patient states she has an appointment with her ENT next week.    The history is provided by the patient and medical records.    Past Medical History:  Diagnosis Date  . Anemia   . Elevated lipids   . Heel spur, left 02/2013  . History of abnormal cervical Pap smear 2006   positive HRHPV  . Tear of meniscus of left knee   . Tear of meniscus of right knee     Patient Active Problem List   Diagnosis Date Noted  . Contraceptive management 01/07/2017  . 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 COLPOSCOPY  2006  . DILATION AND CURETTAGE OF UTERUS  2008   missed abortion  . KNEE ARTHROSCOPY Left 04/29/2016  . KNEE SURGERY Right 08/01/2013   repair of meniscus    OB History    Gravida  3   Para  2   Term  2   Preterm      AB  1   Living  2     SAB  1   IAB      Ectopic      Multiple      Live Births  2            Home Medications    Prior to Admission medications   Medication Sig Start Date End Date Taking? Authorizing Provider  azithromycin (ZITHROMAX) 250 MG tablet Take 1 tablet (250 mg total) by mouth daily. Take first 2 tablets together, then 1 every day until finished. 08/06/20  Yes Mickie Bail, NP  norethindrone-ethinyl estradiol (JUNEL FE 1/20) 1-20 MG-MCG tablet Take 1 tablet by mouth daily. 03/04/20  Yes Copland, Ilona Sorrel,  PA-C  HYDROcodone-acetaminophen (NORCO/VICODIN) 5-325 MG tablet hydrocodone 5 mg-acetaminophen 325 mg tablet  Take 1 tablet every 4 hours by oral route as needed.    [provider]    Family History Family History  Problem Relation Age of Onset  . Hypertension Mother   . Diabetes Sister   . Colon cancer Maternal Grandfather 80  . Bladder Cancer Maternal Aunt 83  . Breast cancer Neg Hx     Social History Social History   Tobacco Use  . Smoking status: Never Smoker  . Smokeless tobacco: Never Used  Vaping Use  . Vaping Use: Never used  Substance Use Topics  . Alcohol use: Yes    Alcohol/week: 2.0 standard drinks    Types: 2 Glasses of wine per week    Comment: occ  . Drug use: No     Allergies   Patient has no known allergies.   Review of Systems Review of Systems  Constitutional: Negative for chills and fever.  HENT: Positive for ear pain. Negative for ear discharge, hearing loss and sore throat.   Eyes: Negative for pain and  visual disturbance.  Respiratory: Negative for cough and shortness of breath.   Cardiovascular: Negative for chest pain and palpitations.  Gastrointestinal: Negative for abdominal pain and vomiting.  Genitourinary: Negative for dysuria and hematuria.  Musculoskeletal: Negative for arthralgias and back pain.  Skin: Negative for color change and rash.  Neurological: Negative for seizures and syncope.  All other systems reviewed and are negative.    Physical Exam Triage Vital Signs ED Triage Vitals  Enc Vitals Group     BP      Pulse      Resp      Temp      Temp src      SpO2      Weight      Height      Head Circumference      Peak Flow      Pain Score      Pain Loc      Pain Edu?      Excl. in GC?    No data found.  Updated Vital Signs BP 138/68 (BP Location: Left Arm)   Pulse 91   Temp 98.7 F (37.1 C) (Oral)   Resp 16   LMP  (LMP Unknown)   SpO2 96%   Visual Acuity Right Eye Distance:   Left Eye  Distance:   Bilateral Distance:    Right Eye Near:   Left Eye Near:    Bilateral Near:     Physical Exam Vitals and nursing note reviewed.  Constitutional:      General: She is not in acute distress.    Appearance: She is well-developed. She is not ill-appearing.  HENT:     Head: Normocephalic and atraumatic.     Right Ear: Tympanic membrane and ear canal normal.     Left Ear: Ear canal normal. Tympanic membrane is erythematous.     Nose: Nose normal.     Mouth/Throat:     Mouth: Mucous membranes are moist.     Pharynx: Oropharynx is clear.  Eyes:     Conjunctiva/sclera: Conjunctivae normal.  Cardiovascular:     Rate and Rhythm: Normal rate and regular rhythm.     Heart sounds: Normal heart sounds.  Pulmonary:     Effort: Pulmonary effort is normal. No respiratory distress.     Breath sounds: Normal breath sounds.  Abdominal:     Palpations: Abdomen is soft.     Tenderness: There is no abdominal tenderness.  Musculoskeletal:     Cervical back: Neck supple.  Skin:    General: Skin is warm and dry.  Neurological:     General: No focal deficit present.     Mental Status: She is alert and oriented to person, place, and time.     Gait: Gait normal.  Psychiatric:        Mood and Affect: Mood normal.        Behavior: Behavior normal.      UC Treatments / Results  Labs (all labs ordered are listed, but only abnormal results are displayed) Labs Reviewed - No data to display  EKG   Radiology No results found.  Procedures Procedures (including critical care time)  Medications Ordered in UC Medications - No data to display  Initial Impression / Assessment and Plan / UC Course  I have reviewed the triage vital signs and the nursing notes.  Pertinent labs & imaging results that were available during my care of the patient were reviewed by me and considered in  my medical decision making (see chart for details).   Left otitis media.  Treating with Zithromax.   Tylenol or ibuprofen as needed for discomfort.  Instructed patient to follow-up with her PCP or ENT if her symptoms or not improving.  She has an appointment with her ENT next week.  She agrees to plan of care.   Final Clinical Impressions(s) / UC Diagnoses   Final diagnoses:  Left otitis media, unspecified otitis media type     Discharge Instructions     Take the Zithromax as directed.    Follow-up with your primary care provider or ENT if your symptoms are not improving.        ED Prescriptions    Medication Sig Dispense Auth. Provider   azithromycin (ZITHROMAX) 250 MG tablet Take 1 tablet (250 mg total) by mouth daily. Take first 2 tablets together, then 1 every day until finished. 6 tablet Mickie Bail, NP     PDMP not reviewed this encounter.   Mickie Bail, NP 08/06/20 [phone]

## 2020-08-20 ENCOUNTER — Ambulatory Visit (INDEPENDENT_AMBULATORY_CARE_PROVIDER_SITE_OTHER): Payer: Self-pay | Admitting: Dermatology

## 2020-08-20 ENCOUNTER — Other Ambulatory Visit: Payer: Self-pay

## 2020-08-20 DIAGNOSIS — L988 Other specified disorders of the skin and subcutaneous tissue: Secondary | ICD-10-CM

## 2020-08-20 NOTE — Patient Instructions (Signed)

## 2020-08-20 NOTE — Progress Notes (Signed)
   Follow-Up Visit   Subjective  Marie Benson is a 50 y.o. female who presents for the following: Facial Elastosis (Patient is here today for a Botox follow up and would like to discuss eyebrow lifts and filler to the nasolabial areas).  The following portions of the chart were reviewed this encounter and updated as appropriate:   Tobacco  Allergies  Meds  Problems  Med Hx  Surg Hx  Fam Hx     Review of Systems:  No other skin or systemic complaints except as noted in HPI or Assessment and Plan.  Objective  Well appearing patient in no apparent distress; mood and affect are within normal limits.  A focused examination was performed including the face. Relevant physical exam findings are noted in the Assessment and Plan.  Objective  Face: Rhytides and volume loss.   Images               Assessment & Plan  Elastosis of skin Face  Botox 5 units injected as marked: - 2.5 units to each brow lift area for a total of 5.0 units  Botox Injection - Face Location: See attached image  Informed consent: Discussed risks (infection, pain, bleeding, bruising, swelling, allergic reaction, paralysis of nearby muscles, eyelid droop, double vision, neck weakness, difficulty breathing, headache, undesirable cosmetic result, and need for additional treatment) and benefits of the procedure, as well as the alternatives.  Informed consent was obtained.  Preparation: The area was cleansed with alcohol.  Procedure Details:  Botox was injected into the dermis with a 30-gauge needle. Pressure applied to any bleeding. Ice packs offered for swelling.  Lot Number:  [id number] Expiration:  08/2022  Total Units Injected:  5  Plan: Patient was instructed to remain upright for 4 hours. Patient was instructed to avoid massaging the face and avoid vigorous exercise for the rest of the day. Tylenol may be used for headache.  Allow 2 weeks before returning to clinic for additional dosing as  needed. Patient will call for any problems.   Return in about 3 months (around 11/20/2020) for cosmetic - Botox & filler (Restylane Refyne to nasolabials and ant chin).  Maylene Roes, CMA, am acting as scribe for Armida Sans, MD .  Documentation: I have reviewed the above documentation for accuracy and completeness, and I agree with the above.  Armida Sans, MD

## 2020-08-27 ENCOUNTER — Encounter: Payer: Self-pay | Admitting: Dermatology

## 2020-11-12 ENCOUNTER — Encounter: Payer: Self-pay | Admitting: Dermatology

## 2020-11-12 ENCOUNTER — Other Ambulatory Visit: Payer: Self-pay

## 2020-11-12 NOTE — Progress Notes (Deleted)
   Follow-Up Visit   Subjective  Marie Benson is a 50 y.o. female who presents for the following: Facial Elastosis (Face, pt presents for botox, last treatment 07/23/20 and 08/20/20).   The following portions of the chart were reviewed this encounter and updated as appropriate:       Review of Systems:  No other skin or systemic complaints except as noted in HPI or Assessment and Plan.  Objective  Well appearing patient in no apparent distress; mood and affect are within normal limits.  A focused examination was performed including face. Relevant physical exam findings are noted in the Assessment and Plan.  Head - Anterior (Face) Rhytides and volume loss.    Assessment & Plan  Elastosis of skin Head - Anterior (Face)  Botox 55 units injected today to: - Frown complex 25 units - Brow lift 2.5 units x 2 - Forehead 10 units - Crows Feet 7.5 units x 2  Filling material injection - Head - Anterior (Face) Location: Frown complex, Brow lift bil, Forehead, Crow's feet bil  Informed consent: Discussed risks (infection, pain, bleeding, bruising, swelling, allergic reaction, paralysis of nearby muscles, eyelid droop, double vision, neck weakness, difficulty breathing, headache, undesirable cosmetic result, and need for additional treatment) and benefits of the procedure, as well as the alternatives.  Informed consent was obtained.  Preparation: The area was cleansed with alcohol.  Procedure Details:  Botox was injected into the dermis with a 30-gauge needle. Pressure applied to any bleeding. Ice packs offered for swelling.  Lot Number:  [id number] Expiration:  06/2022  Total Units Injected:  55  Plan: Patient was instructed to remain upright for 4 hours. Patient was instructed to avoid massaging the face and avoid vigorous exercise for the rest of the day. Tylenol may be used for headache.  Allow 2 weeks before returning to clinic for additional dosing as needed. Patient will call  for any problems.   Return for Botox in 3-4 months.  I, Ardis Rowan, RMA, am acting as scribe for Armida Sans, MD .

## 2020-11-12 NOTE — Progress Notes (Signed)
This encounter was created in error - please disregard.

## 2020-11-19 ENCOUNTER — Ambulatory Visit: Payer: BC Managed Care – PPO | Admitting: Dermatology

## 2021-01-03 ENCOUNTER — Telehealth: Payer: Self-pay | Admitting: Obstetrics and Gynecology

## 2021-01-03 DIAGNOSIS — Z3041 Encounter for surveillance of contraceptive pills: Secondary | ICD-10-CM

## 2021-01-03 NOTE — Telephone Encounter (Signed)
Called and left voicemail for patient to call back to be scheduled. 

## 2021-01-06 NOTE — Telephone Encounter (Signed)
Called pt and left detailed msg saying Rx sent to pharmacy.

## 2021-01-06 NOTE — Telephone Encounter (Signed)
Patient is scheduled for 02/18/21 with ABC

## 2021-01-06 NOTE — Telephone Encounter (Signed)
Could you check to see if medication was refilled for this patient?

## 2021-02-18 ENCOUNTER — Ambulatory Visit (INDEPENDENT_AMBULATORY_CARE_PROVIDER_SITE_OTHER): Payer: BC Managed Care – PPO | Admitting: Obstetrics and Gynecology

## 2021-02-18 ENCOUNTER — Encounter: Payer: Self-pay | Admitting: Obstetrics and Gynecology

## 2021-02-18 ENCOUNTER — Other Ambulatory Visit: Payer: Self-pay

## 2021-02-18 VITALS — BP 120/80 | Ht 66.0 in | Wt 184.0 lb

## 2021-02-18 DIAGNOSIS — Z1231 Encounter for screening mammogram for malignant neoplasm of breast: Secondary | ICD-10-CM | POA: Diagnosis not present

## 2021-02-18 DIAGNOSIS — Z3041 Encounter for surveillance of contraceptive pills: Secondary | ICD-10-CM

## 2021-02-18 DIAGNOSIS — Z01419 Encounter for gynecological examination (general) (routine) without abnormal findings: Secondary | ICD-10-CM

## 2021-02-18 DIAGNOSIS — E7800 Pure hypercholesterolemia, unspecified: Secondary | ICD-10-CM

## 2021-02-18 DIAGNOSIS — Z1211 Encounter for screening for malignant neoplasm of colon: Secondary | ICD-10-CM | POA: Diagnosis not present

## 2021-02-18 DIAGNOSIS — R451 Restlessness and agitation: Secondary | ICD-10-CM

## 2021-02-18 MED ORDER — CLONAZEPAM 0.5 MG PO TABS: 0.5000 mg | ORAL_TABLET | Freq: Every day | ORAL | 0 refills | Status: DC | PRN

## 2021-02-18 MED ORDER — NORETHIN ACE-ETH ESTRAD-FE 1-20 MG-MCG PO TABS: 1.0000 | ORAL_TABLET | Freq: Every day | ORAL | 3 refills | Status: DC

## 2021-02-18 NOTE — Patient Instructions (Signed)
I value your feedback and you entrusting us with your care. If you get a [facility] patient survey, I would appreciate you taking the time to let us know about your experience today. Thank you!  [facility]: [phone]      

## 2021-02-18 NOTE — Progress Notes (Signed)
PCP:  Chad Cordial, PA-C   Chief Complaint  Patient presents with   Gynecologic Exam    No concerns     HPI:      [name] is a 50 y.o. [id number] whose LMP was No LMP recorded. (Menstrual status: Oral contraceptives)., presents today for her annual examination.  Her menses are absent with OCPs. Does placebo pills with no bleeding for many yrs. Dysmenorrhea none. She does not have intermenstrual bleeding.  Sex activity: single partner, contraception - OCP (estrogen/progesterone).  Last Pap: 01/17/19  Results were: no abnormalities /neg HPV DNA 2019 Hx of STDs: HPV with colpo in 2006  Seen for agitation/anxiety 2/21; given clonazepam prn but rarely took it. Would like a RF now to take sparingly.   Last mammogram: 05/29/20 Results were: normal--routine follow-up in 12 months There is no FH of breast cancer. There is no FH of ovarian cancer. The patient does not do self-breast exams.  Tobacco use: The patient denies current or previous tobacco use. Alcohol use: social No drug use.  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 Labs 2019, due for repeat today.  Past Medical History:  Diagnosis Date   Anemia    Elevated lipids    Heel spur, left 02/2013   History of abnormal cervical Pap smear 2006   positive HRHPV   Tear of meniscus of left knee    Tear of meniscus of right knee     Past Surgical History:  Procedure Laterality Date   COLPOSCOPY  2006   DILATION AND CURETTAGE OF UTERUS  2008   missed abortion   KNEE ARTHROSCOPY Left 04/29/2016   KNEE SURGERY Right 08/01/2013   repair of meniscus    Family History  Problem Relation Age of Onset   Hypertension Mother    Diabetes Sister    Colon cancer Maternal Grandfather 26   Bladder Cancer Maternal Aunt 83   Breast cancer Neg Hx     Social History   Socioeconomic  History   Marital status: Married    Spouse name: Not on file   Number of children: 2   Years of education: 16   Highest education level: Not on file  Occupational History   Occupation: Homemaker  Tobacco Use   Smoking status: Never   Smokeless tobacco: Never  Vaping Use   Vaping Use: Never used  Substance and Sexual Activity   Alcohol use: Yes    Alcohol/week: 2.0 standard drinks    Types: 2 Glasses of wine per week    Comment: occ   Drug use: No   Sexual activity: Yes    Partners: Male    Birth control/protection: Pill  Other Topics Concern   Not on file  Social History Narrative   Degree in 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 [employer]   Social Determinants of Radio broadcast assistant Strain: Not on file  Food Insecurity: Not on file  Transportation Needs: Not on file  Physical Activity: Not on file  Stress: Not on file  Social Connections: Not on file  Intimate Partner Violence: Not on file     Current Outpatient Medications:    bimatoprost (LATISSE) 0.03 % ophthalmic solution, APPLY 1 DROP VIA APPLICATOR AT BEDTIME, Disp: , Rfl:    phentermine (ADIPEX-P) 37.5 MG tablet, Take 37.5 mg by mouth daily., Disp: ,  Rfl:    clonazePAM (KLONOPIN) 0.5 MG tablet, Take 1 tablet (0.5 mg total) by mouth daily as needed for anxiety., Disp: 30 tablet, Rfl: 0   norethindrone-ethinyl estradiol-FE (JUNEL FE 1/20) 1-20 MG-MCG tablet, Take 1 tablet by mouth daily., Disp: 84 tablet, Rfl: 3     ROS:  Review of Systems  Constitutional:  Negative for fatigue, fever and unexpected weight change.  Respiratory:  Negative for cough, shortness of breath and wheezing.   Cardiovascular:  Negative for chest pain, palpitations and leg swelling.  Gastrointestinal:  Negative for blood in stool, constipation, diarrhea, nausea and vomiting.  Endocrine: Negative for cold intolerance, heat intolerance and polyuria.  Genitourinary:  Negative for dyspareunia, dysuria, flank pain, frequency, genital  sores, hematuria, menstrual problem, pelvic pain, urgency, vaginal bleeding, vaginal discharge and vaginal pain.  Musculoskeletal:  Negative for arthralgias, back pain, joint swelling and myalgias.  Skin:  Negative for rash.  Neurological:  Negative for dizziness, syncope, light-headedness, numbness and headaches.  Hematological:  Negative for adenopathy.  Psychiatric/Behavioral:  Positive for agitation. Negative for confusion, sleep disturbance and suicidal ideas. The patient is not nervous/anxious.  BREAST: No symptoms   Objective: BP 120/80   Ht 5\' 6"  (1.676 m)   Wt 184 lb (83.5 kg)   BMI 29.70 kg/m    Physical Exam Constitutional:      Appearance: She is well-developed.  Genitourinary:     Vulva normal.     Right Labia: No rash, tenderness or lesions.    Left Labia: No tenderness, lesions or rash.    No vaginal discharge, erythema or tenderness.      Right Adnexa: not tender and no mass present.    Left Adnexa: not tender and no mass present.    No cervical friability or polyp.     Uterus is not enlarged or tender.  Breasts:    Right: No mass, nipple discharge, skin change or tenderness.     Left: No mass, nipple discharge, skin change or tenderness.  Neck:     Thyroid: No thyromegaly.  Cardiovascular:     Rate and Rhythm: Normal rate and regular rhythm.     Heart sounds: Normal heart sounds. No murmur heard. Pulmonary:     Effort: Pulmonary effort is normal.     Breath sounds: Normal breath sounds.  Abdominal:     Palpations: Abdomen is soft.     Tenderness: There is no abdominal tenderness. There is no guarding or rebound.  Musculoskeletal:        General: Normal range of motion.     Cervical back: Normal range of motion.  Lymphadenopathy:     Cervical: No cervical adenopathy.  Neurological:     General: No focal deficit present.     Mental Status: She is alert and oriented to person, place, and time.     Cranial Nerves: No cranial nerve deficit.  Skin:     General: Skin is warm and dry.  Psychiatric:        Mood and Affect: Mood normal.        Behavior: Behavior normal.        Thought Content: Thought content normal.        Judgment: Judgment normal.  Vitals reviewed.    Assessment/Plan: Encounter for annual routine gynecological examination  Encounter for surveillance of contraceptive pills - Plan: norethindrone-ethinyl estradiol-FE (JUNEL FE 1/20) 1-20 MG-MCG tablet; OCR RF.   Encounter for screening mammogram for malignant neoplasm of breast - Plan: MM 3D  SCREEN BREAST BILATERAL; pt to sched mammo  Screening for colon cancer--colonoscopy/cologuard discussed. Pt declines till age 69.  Elevated LDL cholesterol level - Plan: Lipid panel; repeat labs today.   Agitation - Plan: clonazePAM (KLONOPIN) 0.5 MG tablet; Rx RF. Take sparingly.    Meds ordered this encounter  Medications   norethindrone-ethinyl estradiol-FE (JUNEL FE 1/20) 1-20 MG-MCG tablet    Sig: Take 1 tablet by mouth daily.    Dispense:  84 tablet    Refill:  3    DX Code Needed  .    Order Specific Question:   Supervising Provider    Answer:   Gae Dry [id number]   clonazePAM (KLONOPIN) 0.5 MG tablet    Sig: Take 1 tablet (0.5 mg total) by mouth daily as needed for anxiety.    Dispense:  30 tablet    Refill:  0    Order Specific Question:   Supervising Provider    Answer:   Gae Dry [id number]              GYN counsel breast self exam, mammography screening, adequate intake of calcium and vitamin D, diet and exercise     F/U  Return in about 1 year (around 02/18/2022).  Lakita Sahlin B. Krystie Leiter, PA-C 02/18/2021 4:58 PM

## 2021-04-01 ENCOUNTER — Other Ambulatory Visit: Payer: Self-pay

## 2021-04-01 ENCOUNTER — Ambulatory Visit (INDEPENDENT_AMBULATORY_CARE_PROVIDER_SITE_OTHER): Payer: Self-pay | Admitting: Dermatology

## 2021-04-01 DIAGNOSIS — L988 Other specified disorders of the skin and subcutaneous tissue: Secondary | ICD-10-CM

## 2021-04-01 NOTE — Progress Notes (Signed)
° °  Follow-Up Visit   Subjective  Marie Benson is a 50 y.o. female who presents for the following: Facial Elastosis (Patient is here today for Botox injections.).  The following portions of the chart were reviewed this encounter and updated as appropriate:   Tobacco   Allergies   Meds   Problems   Med Hx   Surg Hx   Fam Hx      Review of Systems:  No other skin or systemic complaints except as noted in HPI or Assessment and Plan.  Objective  Well appearing patient in no apparent distress; mood and affect are within normal limits.  A focused examination was performed including the face. Relevant physical exam findings are noted in the Assessment and Plan.  Face Rhytides and volume loss.        Assessment & Plan  Elastosis of skin Face  Botox today -    25 units to frown complex 10 units to forehead 7.5 units each to crow's feet 2.5 units each brow lift  Botox Injection - Face Location: See attached image  Informed consent: Discussed risks (infection, pain, bleeding, bruising, swelling, allergic reaction, paralysis of nearby muscles, eyelid droop, double vision, neck weakness, difficulty breathing, headache, undesirable cosmetic result, and need for additional treatment) and benefits of the procedure, as well as the alternatives.  Informed consent was obtained.  Preparation: The area was cleansed with alcohol.  Procedure Details:  Botox was injected into the dermis with a 30-gauge needle. Pressure applied to any bleeding. Ice packs offered for swelling.  Lot Number:  [id number] Expiration:  05/2023  Total Units Injected:  55  Plan: Tylenol may be used for headache.  Allow 2 weeks before returning to clinic for additional dosing as needed. Patient will call for any problems.    Return in about 3 months (around 06/30/2021) for Botox injections.  Maylene Roes, CMA, am acting as scribe for Armida Sans, MD . Documentation: I have reviewed the above documentation  for accuracy and completeness, and I agree with the above.  Armida Sans, MD

## 2021-04-01 NOTE — Patient Instructions (Signed)

## 2021-04-09 IMAGING — MG DIGITAL SCREENING BILAT W/ TOMO W/ CAD
8 series · 8 of 24 positions shown · non-contrast
Comparison: Previous exam(s).

CLINICAL DATA: Screening.

EXAM:
DIGITAL SCREENING BILATERAL MAMMOGRAM WITH TOMO AND CAD

[L CC synth-2D]
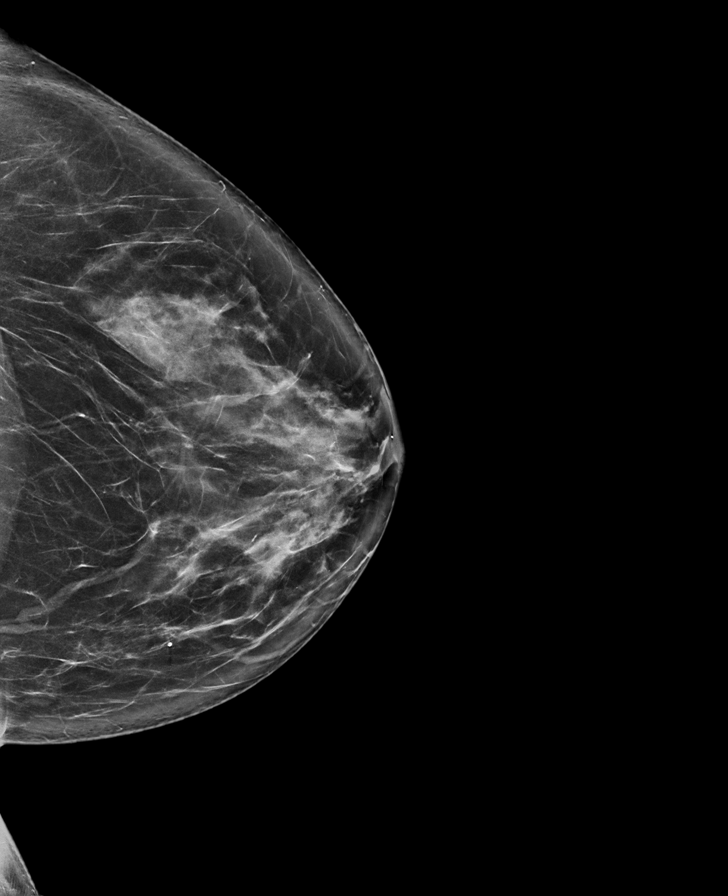

[R CC synth-2D]
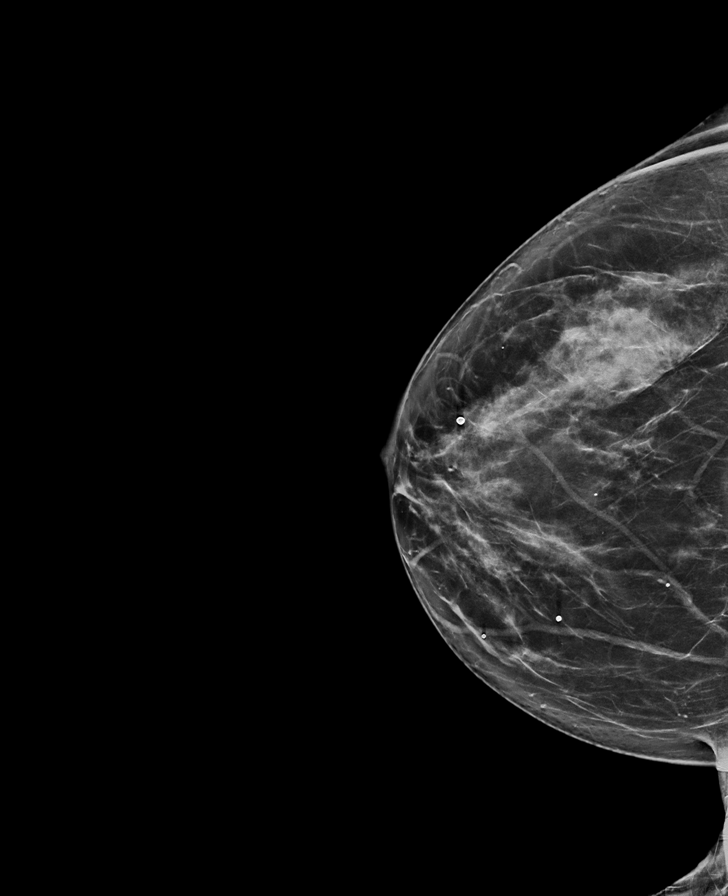

[R MLO synth-2D]
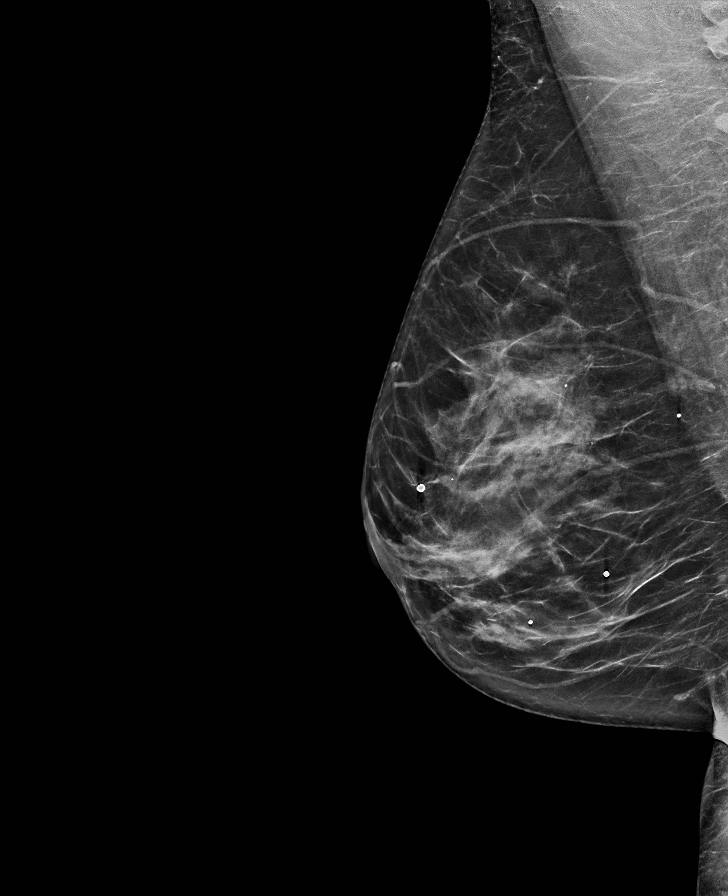

[L MLO synth-2D]
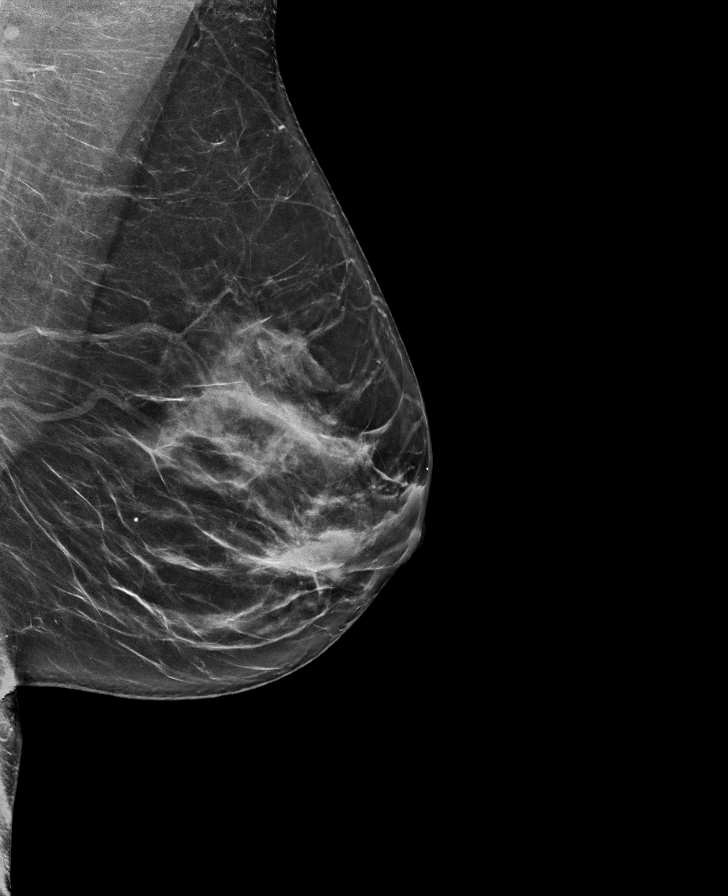

[R CC tomo · tomo slice 38/75.0]
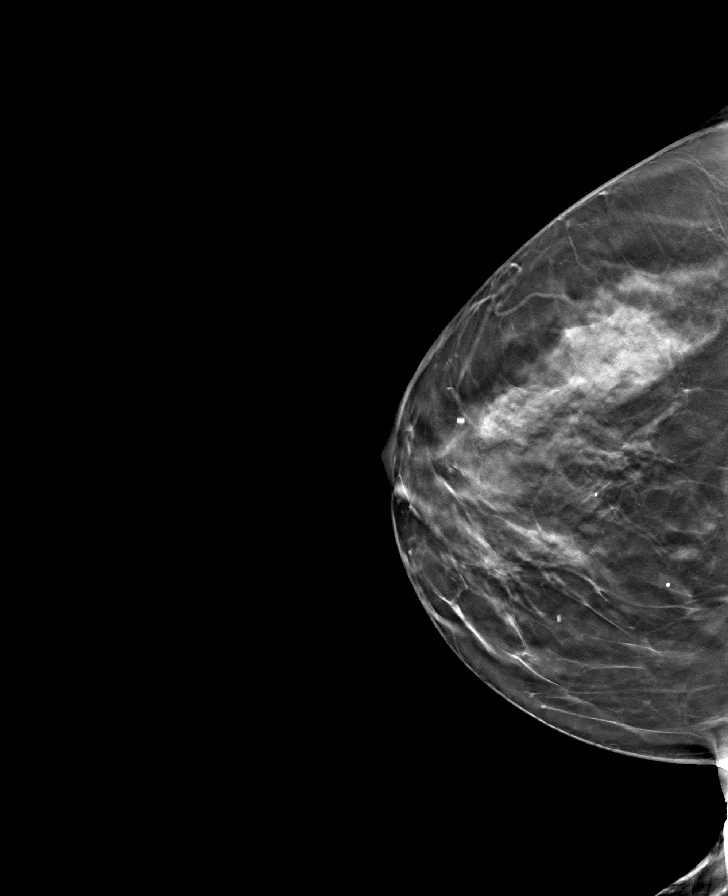

[L MLO tomo · tomo slice 39/77.0]
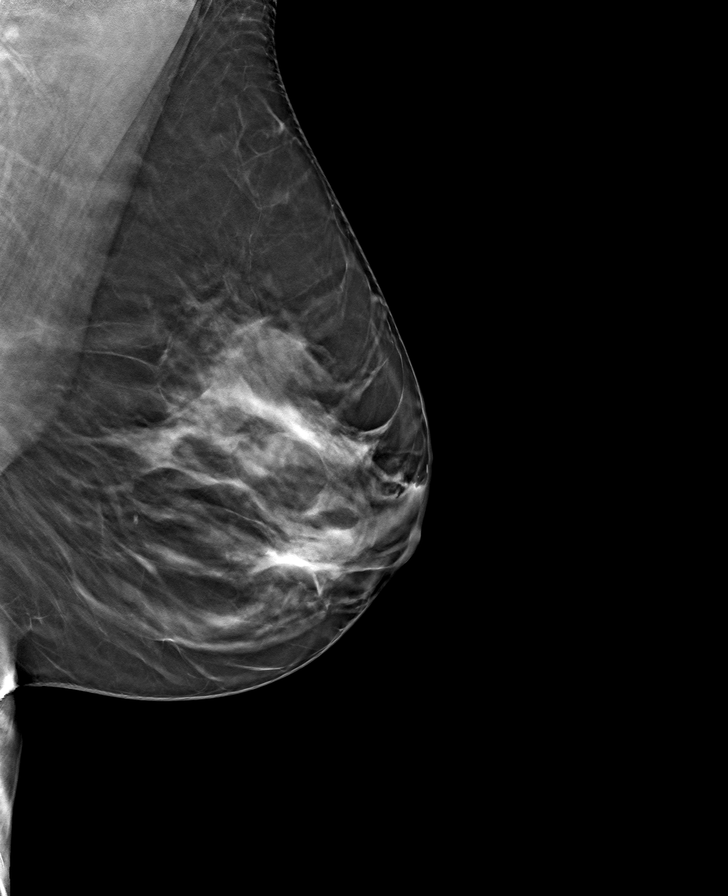

[L CC tomo · tomo slice 41/81.0]
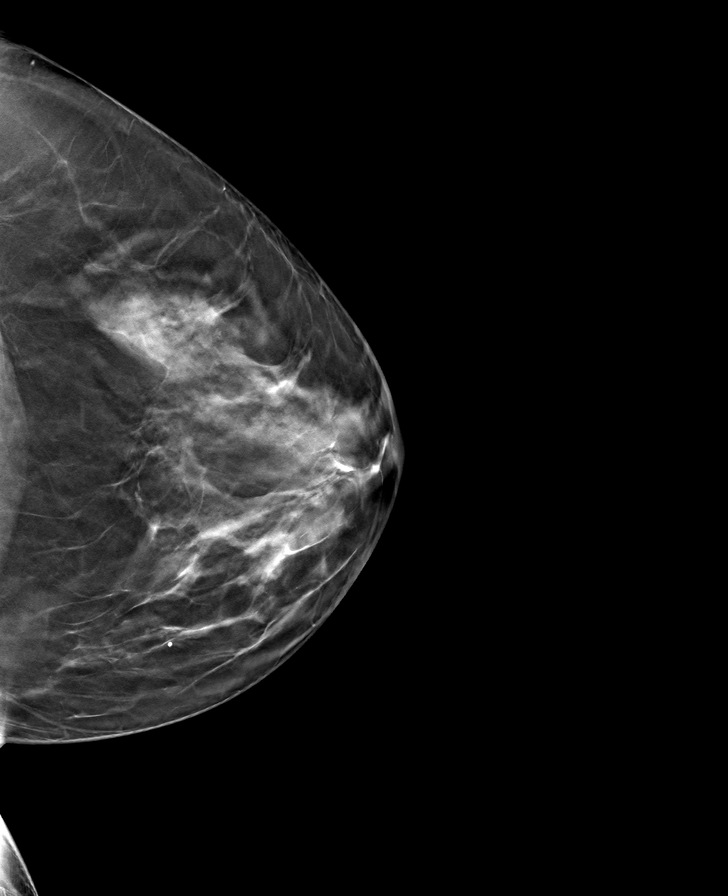

[R MLO tomo · tomo slice 37/74.0]
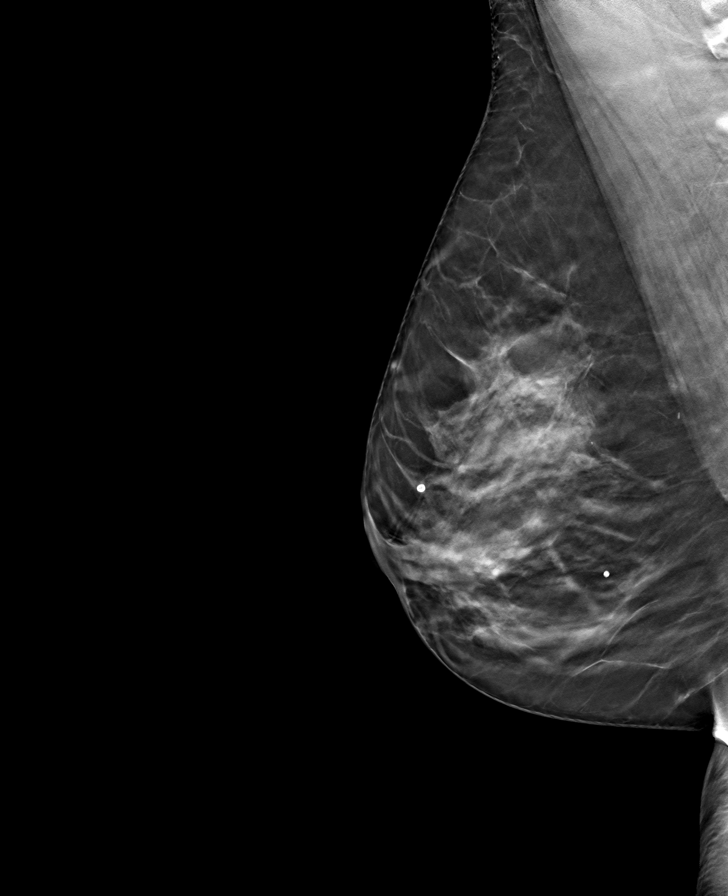

[8 of 24 positions shown; findings below may reference images not displayed]

ACR Breast Density Category c: The breast tissue is heterogeneously
dense, which may obscure small masses.
FINDINGS: There are no findings suspicious for malignancy. Images were
processed with CAD.
IMPRESSION: No mammographic evidence of malignancy. A result letter of this
screening mammogram will be mailed directly to the patient.

RECOMMENDATION:
Screening mammogram in one year. (Code:[id number])

BI-RADS CATEGORY  1: Negative.

## 2021-04-13 ENCOUNTER — Encounter: Payer: Self-pay | Admitting: Dermatology

## 2021-06-23 ENCOUNTER — Encounter: Payer: Self-pay | Admitting: Obstetrics and Gynecology

## 2021-06-26 ENCOUNTER — Ambulatory Visit
Admission: RE | Admit: 2021-06-26 | Discharge: 2021-06-26 | Disposition: A | Discharge status: Home / Self Care | Payer: BC Managed Care – PPO | Source: Ambulatory Visit | Attending: Obstetrics and Gynecology | Admitting: Obstetrics and Gynecology

## 2021-06-26 DIAGNOSIS — Z1231 Encounter for screening mammogram for malignant neoplasm of breast: Secondary | ICD-10-CM | POA: Insufficient documentation

## 2021-08-05 ENCOUNTER — Encounter: Payer: Self-pay | Admitting: Dermatology

## 2021-08-05 ENCOUNTER — Ambulatory Visit (INDEPENDENT_AMBULATORY_CARE_PROVIDER_SITE_OTHER): Payer: Self-pay | Admitting: Dermatology

## 2021-08-05 DIAGNOSIS — L988 Other specified disorders of the skin and subcutaneous tissue: Secondary | ICD-10-CM

## 2021-08-05 NOTE — Progress Notes (Signed)
? ?  Follow-Up Visit ?  ?Subjective  ?Marie Benson is a 51 y.o. female who presents for the following: Facial Elastosis (Here for Botox). ? ?The following portions of the chart were reviewed this encounter and updated as appropriate:  Tobacco  Allergies  Meds  Problems  Med Hx  Surg Hx  Fam Hx   ?  ?Review of Systems: No other skin or systemic complaints except as noted in HPI or Assessment and Plan. ? ?Objective  ?Well appearing patient in no apparent distress; mood and affect are within normal limits. ? ?A focused examination was performed including face. Relevant physical exam findings are noted in the Assessment and Plan. ? ?face ?Rhytides and volume loss.  ? ? ? ? ? ?Assessment & Plan  ?Elastosis of skin ?face ? ?Botox today -  ?  ?25 units to frown complex ?10 units to forehead ?7.5 units each to crow's feet ?2.5 units each brow lift ? ?Botox Injection - face ?Location: See attached image ? ?Informed consent: Discussed risks (infection, pain, bleeding, bruising, swelling, allergic reaction, paralysis of nearby muscles, eyelid droop, double vision, neck weakness, difficulty breathing, headache, undesirable cosmetic result, and need for additional treatment) and benefits of the procedure, as well as the alternatives.  Informed consent was obtained. ? ?Preparation: The area was cleansed with alcohol. ? ?Procedure Details:  Botox was injected into the dermis with a 30-gauge needle. Pressure applied to any bleeding. Ice packs offered for swelling. ? ?Lot Number:  [id number] ?Expiration:  07/2023 ? ?Total Units Injected:  55 ? ?Plan: Patient was instructed to remain upright for 4 hours. Patient was instructed to avoid massaging the face and avoid vigorous exercise for the rest of the day. Tylenol may be used for headache.  Allow 2 weeks before returning to clinic for additional dosing as needed. Patient will call for any problems. ? ?Return for Botox 3-4 months. ? ?I, Emelia Salisbury, CMA, am acting as scribe for  Sarina Ser, MD. ?Documentation: I have reviewed the above documentation for accuracy and completeness, and I agree with the above. ? ?Sarina Ser, MD ? ? ?

## 2021-08-05 NOTE — Patient Instructions (Signed)

## 2021-08-12 ENCOUNTER — Encounter: Payer: Self-pay | Admitting: Dermatology

## 2021-08-19 ENCOUNTER — Other Ambulatory Visit: Payer: Self-pay | Admitting: Obstetrics and Gynecology

## 2021-08-19 DIAGNOSIS — R451 Restlessness and agitation: Secondary | ICD-10-CM

## 2021-12-09 ENCOUNTER — Ambulatory Visit (INDEPENDENT_AMBULATORY_CARE_PROVIDER_SITE_OTHER): Payer: Self-pay | Admitting: Dermatology

## 2021-12-09 ENCOUNTER — Encounter: Payer: Self-pay | Admitting: Dermatology

## 2021-12-09 DIAGNOSIS — L988 Other specified disorders of the skin and subcutaneous tissue: Secondary | ICD-10-CM

## 2021-12-09 NOTE — Patient Instructions (Signed)
Due to recent changes in healthcare laws, you may see results of your pathology and/or laboratory studies on MyChart before the doctors have had a chance to review them. We understand that in some cases there may be results that are confusing or concerning to you. Please understand that not all results are received at the same time and often the doctors may need to interpret multiple results in order to provide you with the best plan of care or course of treatment. Therefore, we ask that you please give us 2 business days to thoroughly review all your results before contacting the office for clarification. Should we see a critical lab result, you will be contacted sooner.   If You Need Anything After Your Visit  If you have any questions or concerns for your doctor, please call our main line at [phone] and press option 4 to reach your doctor's medical assistant. If no one answers, please leave a voicemail as directed and we will return your call as soon as possible. Messages left after 4 pm will be answered the following business day.   You may also send us a message via MyChart. We typically respond to MyChart messages within 1-2 business days.  For prescription refills, please ask your pharmacy to contact our office. Our fax number is [phone].  If you have an urgent issue when the clinic is closed that cannot wait until the next business day, you can page your doctor at the number below.    Please note that while we do our best to be available for urgent issues outside of office hours, we are not available 24/7.   If you have an urgent issue and are unable to reach us, you may choose to seek medical care at your doctor's office, retail clinic, urgent care center, or emergency room.  If you have a medical emergency, please immediately call 911 or go to the emergency department.  Pager Numbers  - Dr. Kowalski: [phone]  - Dr. Moye: [phone]  - Dr. Stewart:  [phone]  In the event of inclement weather, please call our main line at [phone] for an update on the status of any delays or closures.  Dermatology Medication Tips: Please keep the boxes that topical medications come in in order to help keep track of the instructions about where and how to use these. Pharmacies typically print the medication instructions only on the boxes and not directly on the medication tubes.   If your medication is too expensive, please contact our office at [phone] option 4 or send us a message through MyChart.   We are unable to tell what your co-pay for medications will be in advance as this is different depending on your insurance coverage. However, we may be able to find a substitute medication at lower cost or fill out paperwork to get insurance to cover a needed medication.   If a prior authorization is required to get your medication covered by your insurance company, please allow us 1-2 business days to complete this process.  Drug prices often vary depending on where the prescription is filled and some pharmacies may offer cheaper prices.  The website www.goodrx.com contains coupons for medications through different pharmacies. The prices here do not account for what the cost may be with help from insurance (it may be cheaper with your insurance), but the website can give you the price if you did not use any insurance.  - You can print the associated coupon and take it with   your prescription to the pharmacy.  - You may also stop by our office during regular business hours and pick up a GoodRx coupon card.  - If you need your prescription sent electronically to a different pharmacy, notify our office through [facility] MyChart or by phone at [phone] option 4.     Si Usted Necesita Algo Despus de Su Visita  Tambin puede enviarnos un mensaje a travs de MyChart. Por lo general respondemos a los mensajes de MyChart en el transcurso de 1 a 2  das hbiles.  Para renovar recetas, por favor pida a su farmacia que se ponga en contacto con nuestra oficina. Nuestro nmero de fax es el [phone].  Si tiene un asunto urgente cuando la clnica est cerrada y que no puede esperar hasta el siguiente da hbil, puede llamar/localizar a su doctor(a) al nmero que aparece a continuacin.   Por favor, tenga en cuenta que aunque hacemos todo lo posible para estar disponibles para asuntos urgentes fuera del horario de oficina, no estamos disponibles las 24 horas del da, los 7 das de la semana.   Si tiene un problema urgente y no puede comunicarse con nosotros, puede optar por buscar atencin mdica  en el consultorio de su doctor(a), en una clnica privada, en un centro de atencin urgente o en una sala de emergencias.  Si tiene una emergencia mdica, por favor llame inmediatamente al 911 o vaya a la sala de emergencias.  Nmeros de bper  - Dr. Kowalski: [phone]  - Dra. Moye: [phone]  - Dra. Stewart: [phone]  En caso de inclemencias del tiempo, por favor llame a nuestra lnea principal al [phone] para una actualizacin sobre el estado de cualquier retraso o cierre.  Consejos para la medicacin en dermatologa: Por favor, guarde las cajas en las que vienen los medicamentos de uso tpico para ayudarle a seguir las instrucciones sobre dnde y cmo usarlos. Las farmacias generalmente imprimen las instrucciones del medicamento slo en las cajas y no directamente en los tubos del medicamento.   Si su medicamento es muy caro, por favor, pngase en contacto con nuestra oficina llamando al [phone] y presione la opcin 4 o envenos un mensaje a travs de MyChart.   No podemos decirle cul ser su copago por los medicamentos por adelantado ya que esto es diferente dependiendo de la cobertura de su seguro. Sin embargo, es posible que podamos encontrar un medicamento sustituto a menor costo o llenar un formulario para que el  seguro cubra el medicamento que se considera necesario.   Si se requiere una autorizacin previa para que su compaa de seguros cubra su medicamento, por favor permtanos de 1 a 2 das hbiles para completar este proceso.  Los precios de los medicamentos varan con frecuencia dependiendo del lugar de dnde se surte la receta y alguna farmacias pueden ofrecer precios ms baratos.  El sitio web www.goodrx.com tiene cupones para medicamentos de diferentes farmacias. Los precios aqu no tienen en cuenta lo que podra costar con la ayuda del seguro (puede ser ms barato con su seguro), pero el sitio web puede darle el precio si no utiliz ningn seguro.  - Puede imprimir el cupn correspondiente y llevarlo con su receta a la farmacia.  - Tambin puede pasar por nuestra oficina durante el horario de atencin regular y recoger una tarjeta de cupones de GoodRx.  - Si necesita que su receta se enve electrnicamente a una farmacia diferente, informe a nuestra oficina a travs de MyChart de [facility]   o por telfono llamando al [phone] y presione la opcin 4.  

## 2021-12-09 NOTE — Progress Notes (Unsigned)
   Follow-Up Visit   Subjective  Marie Benson is a 51 y.o. female who presents for the following: Facial Elastosis (Here for Botox).  The following portions of the chart were reviewed this encounter and updated as appropriate:  Tobacco  Allergies  Meds  Problems  Med Hx  Surg Hx  Fam Hx     Review of Systems: No other skin or systemic complaints except as noted in HPI or Assessment and Plan.  Objective  Well appearing patient in no apparent distress; mood and affect are within normal limits.  A focused examination was performed including face. Relevant physical exam findings are noted in the Assessment and Plan.  face Rhytides and volume loss.       Assessment & Plan  Elastosis of skin face  Botox today -    25 units to frown complex 10 units to forehead 7.5 units each to crow's feet 2.5 units each brow lift  Botox Injection - face Location: See attached image  Informed consent: Discussed risks (infection, pain, bleeding, bruising, swelling, allergic reaction, paralysis of nearby muscles, eyelid droop, double vision, neck weakness, difficulty breathing, headache, undesirable cosmetic result, and need for additional treatment) and benefits of the procedure, as well as the alternatives.  Informed consent was obtained.  Preparation: The area was cleansed with alcohol.  Procedure Details:  Botox was injected into the dermis with a 30-gauge needle. Pressure applied to any bleeding. Ice packs offered for swelling.  Lot Number:  [id number] Expiration:  02/2024  Total Units Injected:  55  Plan: Patient was instructed to remain upright for 4 hours. Patient was instructed to avoid massaging the face and avoid vigorous exercise for the rest of the day. Tylenol may be used for headache.  Allow 2 weeks before returning to clinic for additional dosing as needed. Patient will call for any problems.    Return for Botox Follow Up 3-4 months.  I, Lawson Radar, CMA, am  acting as scribe for Armida Sans, MD. Documentation: I have reviewed the above documentation for accuracy and completeness, and I agree with the above.  Armida Sans, MD

## 2021-12-10 ENCOUNTER — Encounter: Payer: Self-pay | Admitting: Dermatology

## 2022-03-11 ENCOUNTER — Other Ambulatory Visit: Payer: Self-pay | Admitting: Obstetrics and Gynecology

## 2022-03-11 DIAGNOSIS — Z3041 Encounter for surveillance of contraceptive pills: Secondary | ICD-10-CM

## 2022-03-31 ENCOUNTER — Ambulatory Visit: Payer: BC Managed Care – PPO | Admitting: Dermatology

## 2022-04-14 ENCOUNTER — Telehealth: Payer: Self-pay

## 2022-04-14 DIAGNOSIS — Z13220 Encounter for screening for lipoid disorders: Secondary | ICD-10-CM

## 2022-04-14 DIAGNOSIS — Z0000 Encounter for general adult medical examination without abnormal findings: Secondary | ICD-10-CM

## 2022-04-14 NOTE — Telephone Encounter (Signed)
Lab order placed. Pls notify pt and schedule fasting lab appt. Thx.

## 2022-04-14 NOTE — Telephone Encounter (Signed)
Pt calling to see if she can  get her labs done before her annual on the 15th. Pt aware you are not in office, We would let her know if you ordered ahead of time.

## 2022-04-16 ENCOUNTER — Ambulatory Visit: Payer: BC Managed Care – PPO | Admitting: Obstetrics and Gynecology

## 2022-04-22 ENCOUNTER — Other Ambulatory Visit: Payer: BC Managed Care – PPO

## 2022-04-22 DIAGNOSIS — Z13220 Encounter for screening for lipoid disorders: Secondary | ICD-10-CM

## 2022-04-22 DIAGNOSIS — Z0000 Encounter for general adult medical examination without abnormal findings: Secondary | ICD-10-CM

## 2022-04-23 ENCOUNTER — Ambulatory Visit: Payer: BC Managed Care – PPO | Admitting: Obstetrics and Gynecology

## 2022-04-23 LAB — COMPREHENSIVE METABOLIC PANEL
ALT: 15 IU/L (ref 0–32)
AST: 16 IU/L (ref 0–40)
Albumin/Globulin Ratio: 1.6 (ref 1.2–2.2)
Albumin: 4.5 g/dL (ref 3.9–4.9)
Alkaline Phosphatase: 114 IU/L (ref 44–121)
BUN/Creatinine Ratio: 18 (ref 9–23)
BUN: 14 mg/dL (ref 6–24)
Bilirubin Total: 0.5 mg/dL (ref 0.0–1.2)
CO2: 24 mmol/L (ref 20–29)
Calcium: 9.7 mg/dL (ref 8.7–10.2)
Chloride: 100 mmol/L (ref 96–106)
Creatinine, Ser: 0.79 mg/dL (ref 0.57–1.00)
Globulin, Total: 2.8 g/dL (ref 1.5–4.5)
Glucose: 86 mg/dL (ref 70–99)
Potassium: 4.6 mmol/L (ref 3.5–5.2)
Sodium: 139 mmol/L (ref 134–144)
Total Protein: 7.3 g/dL (ref 6.0–8.5)
eGFR: 92 mL/min/{1.73_m2} (ref >59)

## 2022-04-23 LAB — LIPID PANEL
Chol/HDL Ratio: 3.5 ratio (ref 0.0–4.4)
Cholesterol, Total: 211 mg/dL — ABNORMAL HIGH (ref 100–199)
HDL: 61 mg/dL (ref >39)
LDL Chol Calc (NIH): 135 mg/dL — ABNORMAL HIGH (ref 0–99)
Triglycerides: 82 mg/dL (ref 0–149)
VLDL Cholesterol Cal: 15 mg/dL (ref 5–40)

## 2022-04-26 NOTE — Progress Notes (Unsigned)
PCP:  Chad Cordial, PA-C   No chief complaint on file.    HPI:      Marie Benson is a 52 y.o. [id number] whose LMP was No LMP recorded. (Menstrual status: Oral contraceptives)., presents today for her annual examination.  Her menses are absent with OCPs. Does placebo pills with no bleeding for many yrs. Dysmenorrhea none. She does not have intermenstrual bleeding.  Sex activity: single partner, contraception - OCP (estrogen/progesterone).  Last Pap: 01/17/19  Results were: no abnormalities /neg HPV DNA 2019 Hx of STDs: HPV with colpo in 2006  Seen for agitation/anxiety 2/21; given clonazepam prn but rarely took it. Would like a RF now to take sparingly.   Last mammogram: 06/26/21 Results were: normal--routine follow-up in 12 months There is no FH of breast cancer. There is no FH of ovarian cancer. The patient does not do self-breast exams.  Tobacco use: The patient denies current or previous tobacco use. Alcohol use: social No drug use.  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 Labs 2019, borderline lipids 11/21 and 1/24  Past Medical History:  Diagnosis Date   Anemia    Elevated lipids    Heel spur, left 02/2013   History of abnormal cervical Pap smear 2006   positive HRHPV   Tear of meniscus of left knee    Tear of meniscus of right knee     Past Surgical History:  Procedure Laterality Date   COLPOSCOPY  2006   DILATION AND CURETTAGE OF UTERUS  2008   missed abortion   KNEE ARTHROSCOPY Left 04/29/2016   KNEE SURGERY Right 08/01/2013   repair of meniscus    Family History  Problem Relation Age of Onset   Hypertension Mother    Diabetes Sister    Colon cancer Maternal Grandfather 59   Bladder Cancer Maternal Aunt 83   Breast cancer Neg Hx     Social History   Socioeconomic History   Marital status: Married     Spouse name: Not on file   Number of children: 2   Years of education: 16   Highest education level: Not on file  Occupational History   Occupation: Homemaker  Tobacco Use   Smoking status: Never   Smokeless tobacco: Never  Vaping Use   Vaping Use: Never used  Substance and Sexual Activity   Alcohol use: Yes    Alcohol/week: 2.0 standard drinks of alcohol    Types: 2 Glasses of wine per week    Comment: occ   Drug use: No   Sexual activity: Yes    Partners: Male    Birth control/protection: Pill  Other Topics Concern   Not on file  Social History Narrative   Degree in 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 [employer]   Social Determinants of Radio broadcast assistant Strain: Not on file  Food Insecurity: Not on file  Transportation Needs: Not on file  Physical Activity: Not on file  Stress: Not on file  Social Connections: Not on file  Intimate Partner Violence: Not on file     Current Outpatient Medications:    bimatoprost (LATISSE) 0.03 % ophthalmic solution, APPLY 1 DROP VIA APPLICATOR AT BEDTIME, Disp: , Rfl:    clonazePAM (KLONOPIN) 0.5 MG tablet, TAKE 1 TABLET BY MOUTH EVERY DAY AS NEEDED FOR ANXIETY, Disp: 30 tablet, Rfl: 0  norethindrone-ethinyl estradiol-FE (JUNEL FE 1/20) 1-20 MG-MCG tablet, TAKE 1 TABLET BY MOUTH EVERY DAY, Disp: 84 tablet, Rfl: 0   phentermine (ADIPEX-P) 37.5 MG tablet, Take 37.5 mg by mouth daily., Disp: , Rfl:      ROS:  Review of Systems  Constitutional:  Negative for fatigue, fever and unexpected weight change.  Respiratory:  Negative for cough, shortness of breath and wheezing.   Cardiovascular:  Negative for chest pain, palpitations and leg swelling.  Gastrointestinal:  Negative for blood in stool, constipation, diarrhea, nausea and vomiting.  Endocrine: Negative for cold intolerance, heat intolerance and polyuria.  Genitourinary:  Negative for dyspareunia, dysuria, flank pain, frequency, genital sores, hematuria, menstrual problem,  pelvic pain, urgency, vaginal bleeding, vaginal discharge and vaginal pain.  Musculoskeletal:  Negative for arthralgias, back pain, joint swelling and myalgias.  Skin:  Negative for rash.  Neurological:  Negative for dizziness, syncope, light-headedness, numbness and headaches.  Hematological:  Negative for adenopathy.  Psychiatric/Behavioral:  Positive for agitation. Negative for confusion, sleep disturbance and suicidal ideas. The patient is not nervous/anxious.   BREAST: No symptoms   Objective: There were no vitals taken for this visit.   Physical Exam Constitutional:      Appearance: She is well-developed.  Genitourinary:     Vulva normal.     Right Labia: No rash, tenderness or lesions.    Left Labia: No tenderness, lesions or rash.    No vaginal discharge, erythema or tenderness.      Right Adnexa: not tender and no mass present.    Left Adnexa: not tender and no mass present.    No cervical friability or polyp.     Uterus is not enlarged or tender.  Breasts:    Right: No mass, nipple discharge, skin change or tenderness.     Left: No mass, nipple discharge, skin change or tenderness.  Neck:     Thyroid: No thyromegaly.  Cardiovascular:     Rate and Rhythm: Normal rate and regular rhythm.     Heart sounds: Normal heart sounds. No murmur heard. Pulmonary:     Effort: Pulmonary effort is normal.     Breath sounds: Normal breath sounds.  Abdominal:     Palpations: Abdomen is soft.     Tenderness: There is no abdominal tenderness. There is no guarding or rebound.  Musculoskeletal:        General: Normal range of motion.     Cervical back: Normal range of motion.  Lymphadenopathy:     Cervical: No cervical adenopathy.  Neurological:     General: No focal deficit present.     Mental Status: She is alert and oriented to person, place, and time.     Cranial Nerves: No cranial nerve deficit.  Skin:    General: Skin is warm and dry.  Psychiatric:        Mood and  Affect: Mood normal.        Behavior: Behavior normal.        Thought Content: Thought content normal.        Judgment: Judgment normal.  Vitals reviewed.     Assessment/Plan: Encounter for annual routine gynecological examination  Encounter for surveillance of contraceptive pills - Plan: norethindrone-ethinyl estradiol-FE (JUNEL FE 1/20) 1-20 MG-MCG tablet; OCR RF.   Encounter for screening mammogram for malignant neoplasm of breast - Plan: MM 3D SCREEN BREAST BILATERAL; pt to sched mammo  Screening for colon cancer--colonoscopy/cologuard discussed. Pt declines till age 54.  Elevated LDL cholesterol level -  Plan: Lipid panel; repeat labs today.   Agitation - Plan: clonazePAM (KLONOPIN) 0.5 MG tablet; Rx RF. Take sparingly.    No orders of the defined types were placed in this encounter.             GYN counsel breast self exam, mammography screening, adequate intake of calcium and vitamin D, diet and exercise     F/U  No follow-ups on file.  Vardaan Depascale B. Adien Kimmel, PA-C 04/26/2022 7:17 PM

## 2022-04-27 ENCOUNTER — Other Ambulatory Visit (HOSPITAL_COMMUNITY)
Admission: RE | Admit: 2022-04-27 | Discharge: 2022-04-27 | Disposition: A | Discharge status: Home / Self Care | Payer: BC Managed Care – PPO | Source: Ambulatory Visit | Attending: Obstetrics and Gynecology | Admitting: Obstetrics and Gynecology

## 2022-04-27 ENCOUNTER — Ambulatory Visit (INDEPENDENT_AMBULATORY_CARE_PROVIDER_SITE_OTHER): Payer: BC Managed Care – PPO | Admitting: Obstetrics and Gynecology

## 2022-04-27 ENCOUNTER — Encounter: Payer: Self-pay | Admitting: Obstetrics and Gynecology

## 2022-04-27 VITALS — BP 120/70 | Ht 64.0 in | Wt 184.0 lb

## 2022-04-27 DIAGNOSIS — R451 Restlessness and agitation: Secondary | ICD-10-CM

## 2022-04-27 DIAGNOSIS — Z1151 Encounter for screening for human papillomavirus (HPV): Secondary | ICD-10-CM | POA: Diagnosis not present

## 2022-04-27 DIAGNOSIS — Z780 Asymptomatic menopausal state: Secondary | ICD-10-CM | POA: Diagnosis not present

## 2022-04-27 DIAGNOSIS — Z1231 Encounter for screening mammogram for malignant neoplasm of breast: Secondary | ICD-10-CM

## 2022-04-27 DIAGNOSIS — Z0000 Encounter for general adult medical examination without abnormal findings: Secondary | ICD-10-CM | POA: Diagnosis not present

## 2022-04-27 DIAGNOSIS — Z124 Encounter for screening for malignant neoplasm of cervix: Secondary | ICD-10-CM | POA: Insufficient documentation

## 2022-04-27 DIAGNOSIS — Z1211 Encounter for screening for malignant neoplasm of colon: Secondary | ICD-10-CM

## 2022-04-27 DIAGNOSIS — Z1329 Encounter for screening for other suspected endocrine disorder: Secondary | ICD-10-CM | POA: Diagnosis not present

## 2022-04-27 DIAGNOSIS — N951 Menopausal and female climacteric states: Secondary | ICD-10-CM | POA: Diagnosis not present

## 2022-04-27 DIAGNOSIS — Z01419 Encounter for gynecological examination (general) (routine) without abnormal findings: Secondary | ICD-10-CM | POA: Diagnosis not present

## 2022-04-27 DIAGNOSIS — Z3041 Encounter for surveillance of contraceptive pills: Secondary | ICD-10-CM

## 2022-04-27 MED ORDER — NORETHINDRONE-ETH ESTRADIOL 1-5 MG-MCG PO TABS: 1.0000 | ORAL_TABLET | Freq: Every day | ORAL | 3 refills | Status: DC

## 2022-04-27 MED ORDER — CLONAZEPAM 0.5 MG PO TABS: 0.5000 mg | ORAL_TABLET | Freq: Two times a day (BID) | ORAL | 1 refills | Status: DC | PRN

## 2022-04-27 NOTE — Patient Instructions (Signed)
I value your feedback and you entrusting us with your care. If you get a [facility] patient survey, I would appreciate you taking the time to let us know about your experience today. Thank you!  [facility]: [phone]      

## 2022-04-28 LAB — T4, FREE: Free T4: 0.93 ng/dL (ref 0.82–1.77)

## 2022-04-28 LAB — TSH: TSH: 1.26 u[IU]/mL (ref 0.450–4.500)

## 2022-04-28 LAB — FOLLICLE STIMULATING HORMONE: FSH: 76.2 m[IU]/mL

## 2022-04-28 LAB — ESTRADIOL: Estradiol: 8.7 pg/mL

## 2022-04-29 LAB — CYTOLOGY - PAP
Comment: NEGATIVE
Diagnosis: NEGATIVE
High risk HPV: NEGATIVE

## 2022-05-05 ENCOUNTER — Ambulatory Visit (INDEPENDENT_AMBULATORY_CARE_PROVIDER_SITE_OTHER): Payer: BC Managed Care – PPO | Admitting: Dermatology

## 2022-05-05 DIAGNOSIS — L988 Other specified disorders of the skin and subcutaneous tissue: Secondary | ICD-10-CM

## 2022-05-05 NOTE — Progress Notes (Signed)
   Follow-Up Visit   Subjective  Marie Benson is a 52 y.o. female who presents for the following: Facial Elastosis (Botox today).  The following portions of the chart were reviewed this encounter and updated as appropriate:   Tobacco  Allergies  Meds  Problems  Med Hx  Surg Hx  Fam Hx     Review of Systems:  No other skin or systemic complaints except as noted in HPI or Assessment and Plan.  Objective  Well appearing patient in no apparent distress; mood and affect are within normal limits.  A focused examination was performed including face. Relevant physical exam findings are noted in the Assessment and Plan.  Face Rhytides and volume loss.       Assessment & Plan  Elastosis of skin Face  Botox today - 55 units   25 units to frown complex 10 units to forehead 7.5 units each to crow's feet 2.5 units each brow lift  Botox Injection - Face Location: See attached image  Informed consent: Discussed risks (infection, pain, bleeding, bruising, swelling, allergic reaction, paralysis of nearby muscles, eyelid droop, double vision, neck weakness, difficulty breathing, headache, undesirable cosmetic result, and need for additional treatment) and benefits of the procedure, as well as the alternatives.  Informed consent was obtained.  Preparation: The area was cleansed with alcohol.  Procedure Details:  Botox was injected into the dermis with a 30-gauge needle. Pressure applied to any bleeding. Ice packs offered for swelling.  Lot Number:  [id number] Expiration:  06/2024  Total Units Injected:  55  Plan: Patient was instructed to remain upright for 4 hours. Patient was instructed to avoid massaging the face and avoid vigorous exercise for the rest of the day. Tylenol may be used for headache.  Allow 2 weeks before returning to clinic for additional dosing as needed. Patient will call for any problems.    Return for Botox in 3-4 months.  I, Ashok Cordia, CMA, am  acting as scribe for Sarina Ser, MD . Documentation: I have reviewed the above documentation for accuracy and completeness, and I agree with the above.  Sarina Ser, MD

## 2022-05-05 NOTE — Patient Instructions (Signed)
Due to recent changes in healthcare laws, you may see results of your pathology and/or laboratory studies on MyChart before the doctors have had a chance to review them. We understand that in some cases there may be results that are confusing or concerning to you. Please understand that not all results are received at the same time and often the doctors may need to interpret multiple results in order to provide you with the best plan of care or course of treatment. Therefore, we ask that you please give us 2 business days to thoroughly review all your results before contacting the office for clarification. Should we see a critical lab result, you will be contacted sooner.   If You Need Anything After Your Visit  If you have any questions or concerns for your doctor, please call our main line at [phone] and press option 4 to reach your doctor's medical assistant. If no one answers, please leave a voicemail as directed and we will return your call as soon as possible. Messages left after 4 pm will be answered the following business day.   You may also send us a message via MyChart. We typically respond to MyChart messages within 1-2 business days.  For prescription refills, please ask your pharmacy to contact our office. Our fax number is [phone].  If you have an urgent issue when the clinic is closed that cannot wait until the next business day, you can page your doctor at the number below.    Please note that while we do our best to be available for urgent issues outside of office hours, we are not available 24/7.   If you have an urgent issue and are unable to reach us, you may choose to seek medical care at your doctor's office, retail clinic, urgent care center, or emergency room.  If you have a medical emergency, please immediately call 911 or go to the emergency department.  Pager Numbers  - Dr. Kowalski: [phone]  - Dr. Moye: [phone]  - Dr. Stewart:  [phone]  In the event of inclement weather, please call our main line at [phone] for an update on the status of any delays or closures.  Dermatology Medication Tips: Please keep the boxes that topical medications come in in order to help keep track of the instructions about where and how to use these. Pharmacies typically print the medication instructions only on the boxes and not directly on the medication tubes.   If your medication is too expensive, please contact our office at [phone] option 4 or send us a message through MyChart.   We are unable to tell what your co-pay for medications will be in advance as this is different depending on your insurance coverage. However, we may be able to find a substitute medication at lower cost or fill out paperwork to get insurance to cover a needed medication.   If a prior authorization is required to get your medication covered by your insurance company, please allow us 1-2 business days to complete this process.  Drug prices often vary depending on where the prescription is filled and some pharmacies may offer cheaper prices.  The website www.goodrx.com contains coupons for medications through different pharmacies. The prices here do not account for what the cost may be with help from insurance (it may be cheaper with your insurance), but the website can give you the price if you did not use any insurance.  - You can print the associated coupon and take it with   your prescription to the pharmacy.  - You may also stop by our office during regular business hours and pick up a GoodRx coupon card.  - If you need your prescription sent electronically to a different pharmacy, notify our office through [facility] MyChart or by phone at [phone] option 4.     Si Usted Necesita Algo Despus de Su Visita  Tambin puede enviarnos un mensaje a travs de MyChart. Por lo general respondemos a los mensajes de MyChart en el transcurso de 1 a 2  das hbiles.  Para renovar recetas, por favor pida a su farmacia que se ponga en contacto con nuestra oficina. Nuestro nmero de fax es el [phone].  Si tiene un asunto urgente cuando la clnica est cerrada y que no puede esperar hasta el siguiente da hbil, puede llamar/localizar a su doctor(a) al nmero que aparece a continuacin.   Por favor, tenga en cuenta que aunque hacemos todo lo posible para estar disponibles para asuntos urgentes fuera del horario de oficina, no estamos disponibles las 24 horas del da, los 7 das de la semana.   Si tiene un problema urgente y no puede comunicarse con nosotros, puede optar por buscar atencin mdica  en el consultorio de su doctor(a), en una clnica privada, en un centro de atencin urgente o en una sala de emergencias.  Si tiene una emergencia mdica, por favor llame inmediatamente al 911 o vaya a la sala de emergencias.  Nmeros de bper  - Dr. Kowalski: [phone]  - Dra. Moye: [phone]  - Dra. Stewart: [phone]  En caso de inclemencias del tiempo, por favor llame a nuestra lnea principal al [phone] para una actualizacin sobre el estado de cualquier retraso o cierre.  Consejos para la medicacin en dermatologa: Por favor, guarde las cajas en las que vienen los medicamentos de uso tpico para ayudarle a seguir las instrucciones sobre dnde y cmo usarlos. Las farmacias generalmente imprimen las instrucciones del medicamento slo en las cajas y no directamente en los tubos del medicamento.   Si su medicamento es muy caro, por favor, pngase en contacto con nuestra oficina llamando al [phone] y presione la opcin 4 o envenos un mensaje a travs de MyChart.   No podemos decirle cul ser su copago por los medicamentos por adelantado ya que esto es diferente dependiendo de la cobertura de su seguro. Sin embargo, es posible que podamos encontrar un medicamento sustituto a menor costo o llenar un formulario para que el  seguro cubra el medicamento que se considera necesario.   Si se requiere una autorizacin previa para que su compaa de seguros cubra su medicamento, por favor permtanos de 1 a 2 das hbiles para completar este proceso.  Los precios de los medicamentos varan con frecuencia dependiendo del lugar de dnde se surte la receta y alguna farmacias pueden ofrecer precios ms baratos.  El sitio web www.goodrx.com tiene cupones para medicamentos de diferentes farmacias. Los precios aqu no tienen en cuenta lo que podra costar con la ayuda del seguro (puede ser ms barato con su seguro), pero el sitio web puede darle el precio si no utiliz ningn seguro.  - Puede imprimir el cupn correspondiente y llevarlo con su receta a la farmacia.  - Tambin puede pasar por nuestra oficina durante el horario de atencin regular y recoger una tarjeta de cupones de GoodRx.  - Si necesita que su receta se enve electrnicamente a una farmacia diferente, informe a nuestra oficina a travs de MyChart de [facility]   o por telfono llamando al [phone] y presione la opcin 4.  

## 2022-05-13 ENCOUNTER — Encounter: Payer: Self-pay | Admitting: Dermatology

## 2022-07-07 ENCOUNTER — Ambulatory Visit
Admission: RE | Admit: 2022-07-07 | Discharge: 2022-07-07 | Disposition: A | Discharge status: Home / Self Care | Payer: BC Managed Care – PPO | Source: Ambulatory Visit | Attending: Obstetrics and Gynecology | Admitting: Obstetrics and Gynecology

## 2022-07-07 DIAGNOSIS — Z1231 Encounter for screening mammogram for malignant neoplasm of breast: Secondary | ICD-10-CM | POA: Diagnosis not present

## 2022-07-22 DIAGNOSIS — Z96653 Presence of artificial knee joint, bilateral: Secondary | ICD-10-CM | POA: Diagnosis not present

## 2022-08-19 ENCOUNTER — Ambulatory Visit: Payer: BC Managed Care – PPO | Admitting: Dermatology

## 2022-10-04 NOTE — Progress Notes (Unsigned)
Josaiah Muhammed, Marie Sorrel, PA-C   Chief Complaint  Patient presents with   Breast Exam    Lump on right breast, tender since yesterday    HPI:      Marie Benson is a 52 y.o. [id number] whose LMP was No LMP recorded. (Menstrual status: Oral contraceptives)., presents today for what she thought was a bite on her chest. Husband thought looked larger and wondered if swollen lymph node.  Pt then started with SBE and noticed tender area RT UOQ vs axilla in showed. Felt like small mass, can't feel it today. Tender since continues to touch it per pt. Neg mammo 3/24; no FH breast cancer. Pt with anxiety/sleep issues with teenager now. Takes clonazepam occas. Helps with sleep. Pt thinks if can sleep better, her stress will be better. Feels good otherwise. Taking zyquil to sleep most nights. Is exercising.  Wants GI ref for colonoscopy now.   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    Past Surgical History:  Procedure Laterality Date   COLPOSCOPY  2006   DILATION AND CURETTAGE OF UTERUS  2008   missed abortion   KNEE ARTHROSCOPY Left 04/29/2016   KNEE SURGERY Right 08/01/2013   repair of meniscus    Family History  Problem Relation Age of Onset   Hypertension Mother    Diabetes Sister    Colon cancer Maternal Grandfather 35   Bladder Cancer Maternal Aunt 59   Breast cancer Neg Hx     Social History   Socioeconomic History   Marital status: Married    Spouse name: Not on file   Number of children: 2   Years of education: 16   Highest education level: Not on file  Occupational History   Occupation: Homemaker  Tobacco Use   Smoking status: Never   Smokeless tobacco: Never  Vaping Use   Vaping Use: Never used  Substance and Sexual Activity   Alcohol use: Yes    Alcohol/week: 2.0 standard drinks of alcohol    Types: 2 Glasses of wine per week    Comment: occ   Drug use: No    Sexual activity: Yes    Partners: Male    Birth control/protection: None  Other Topics Concern   Not on file  Social History Narrative   Degree in accounting. Serves as Musician for [employer]   Social Determinants of Corporate investment banker Strain: Not on file  Food Insecurity: Not on file  Transportation Needs: Not on file  Physical Activity: Not on file  Stress: Not on file  Social Connections: Not on file  Intimate Partner Violence: Not on file    Outpatient Medications Prior to Visit  Medication Sig Dispense Refill   clonazePAM (KLONOPIN) 0.5 MG tablet Take 1 tablet (0.5 mg total) by mouth 2 (two) times daily as needed for anxiety. 30 tablet 1   norethindrone-ethinyl estradiol (FEMHRT 1/5) 1-5 MG-MCG TABS tablet Take 1 tablet by mouth daily. 84 tablet 3   OZEMPIC, 2 MG/DOSE, 8 MG/3ML SOPN Inject 2 mg into the skin once a week.     No facility-administered medications prior to visit.      ROS:  Review of Systems  Constitutional:  Negative for fever.  Gastrointestinal:  Negative for blood in stool, constipation, diarrhea, nausea and vomiting.  Genitourinary:  Negative for dyspareunia, dysuria, flank pain, frequency, hematuria, urgency, vaginal bleeding, vaginal discharge and vaginal pain.  Musculoskeletal:  Negative for back pain.  Skin:  Negative for rash.   BREAST: mass/tenderness   OBJECTIVE:   Vitals:  BP 110/80   Ht 5\' 5"  (1.651 m)   Wt 157 lb (71.2 kg)   BMI 26.13 kg/m   Physical Exam Vitals reviewed.  Pulmonary:     Effort: Pulmonary effort is normal.  Chest:  Breasts:    Breasts are symmetrical.     Right: No inverted nipple, mass, nipple discharge, skin change or tenderness.     Left: No inverted nipple, mass, nipple discharge, skin change or tenderness.    Musculoskeletal:        General: Normal range of motion.     Cervical back: Normal range of motion.  Skin:    General: Skin is warm and dry.  Neurological:     General: No focal deficit  present.     Mental Status: She is alert and oriented to person, place, and time.     Cranial Nerves: No cranial nerve deficit.  Psychiatric:        Mood and Affect: Mood normal.        Behavior: Behavior normal.        Thought Content: Thought content normal.        Judgment: Judgment normal.      Assessment/Plan: Breast tenderness--neg breast exam for tenderness/mass RUOQ. Reassurance re: normal sternal end of clavicle bilat. F/u prn.   Primary insomnia - Plan: traZODone (DESYREL) 50 MG tablet; try trazodone 50 mg. May increase to 100 mg. Clonazepam sparingly and not with trazodone. F/u prn.   Screening for colon cancer - Plan: Ambulatory referral to Gastroenterology    Meds ordered this encounter  Medications   traZODone (DESYREL) 50 MG tablet    Sig: Take 1 tablet (50 mg total) by mouth at bedtime as needed for sleep.    Dispense:  30 tablet    Refill:  2    Order Specific Question:   Supervising Provider    Answer:   Hildred Laser [[id number]]      Return if symptoms worsen or fail to improve.  Towana Stenglein B. Makiah Foye, PA-C 10/05/2022 11:45 AM

## 2022-10-05 ENCOUNTER — Encounter: Payer: Self-pay | Admitting: Obstetrics and Gynecology

## 2022-10-05 ENCOUNTER — Ambulatory Visit (INDEPENDENT_AMBULATORY_CARE_PROVIDER_SITE_OTHER): Payer: BC Managed Care – PPO | Admitting: Obstetrics and Gynecology

## 2022-10-05 VITALS — BP 110/80 | Ht 65.0 in | Wt 157.0 lb

## 2022-10-05 DIAGNOSIS — Z01419 Encounter for gynecological examination (general) (routine) without abnormal findings: Secondary | ICD-10-CM

## 2022-10-05 DIAGNOSIS — N644 Mastodynia: Secondary | ICD-10-CM

## 2022-10-05 DIAGNOSIS — F5101 Primary insomnia: Secondary | ICD-10-CM | POA: Diagnosis not present

## 2022-10-05 DIAGNOSIS — Z1211 Encounter for screening for malignant neoplasm of colon: Secondary | ICD-10-CM

## 2022-10-05 DIAGNOSIS — Z1231 Encounter for screening mammogram for malignant neoplasm of breast: Secondary | ICD-10-CM

## 2022-10-05 MED ORDER — TRAZODONE HCL 50 MG PO TABS: 50.0000 mg | ORAL_TABLET | Freq: Every evening | ORAL | 2 refills | Status: DC | PRN

## 2022-10-07 ENCOUNTER — Encounter: Payer: Self-pay | Admitting: Dermatology

## 2022-10-07 ENCOUNTER — Ambulatory Visit (INDEPENDENT_AMBULATORY_CARE_PROVIDER_SITE_OTHER): Payer: Self-pay | Admitting: Dermatology

## 2022-10-07 VITALS — BP 109/73 | HR 84

## 2022-10-07 DIAGNOSIS — L988 Other specified disorders of the skin and subcutaneous tissue: Secondary | ICD-10-CM

## 2022-10-07 NOTE — Patient Instructions (Addendum)
Due to recent changes in healthcare laws, you may see results of your pathology and/or laboratory studies on MyChart before the doctors have had a chance to review them. We understand that in some cases there may be results that are confusing or concerning to you. Please understand that not all results are received at the same time and often the doctors may need to interpret multiple results in order to provide you with the best plan of care or course of treatment. Therefore, we ask that you please give us 2 business days to thoroughly review all your results before contacting the office for clarification. Should we see a critical lab result, you will be contacted sooner.   If You Need Anything After Your Visit  If you have any questions or concerns for your doctor, please call our main line at [phone] and press option 4 to reach your doctor's medical assistant. If no one answers, please leave a voicemail as directed and we will return your call as soon as possible. Messages left after 4 pm will be answered the following business day.   You may also send us a message via MyChart. We typically respond to MyChart messages within 1-2 business days.  For prescription refills, please ask your pharmacy to contact our office. Our fax number is [phone].  If you have an urgent issue when the clinic is closed that cannot wait until the next business day, you can page your doctor at the number below.    Please note that while we do our best to be available for urgent issues outside of office hours, we are not available 24/7.   If you have an urgent issue and are unable to reach us, you may choose to seek medical care at your doctor's office, retail clinic, urgent care center, or emergency room.  If you have a medical emergency, please immediately call 911 or go to the emergency department.  Pager Numbers  - Dr. Kowalski: [phone]  - Dr. Moye: [phone]  - Dr. Stewart:  [phone]  In the event of inclement weather, please call our main line at [phone] for an update on the status of any delays or closures.  Dermatology Medication Tips: Please keep the boxes that topical medications come in in order to help keep track of the instructions about where and how to use these. Pharmacies typically print the medication instructions only on the boxes and not directly on the medication tubes.   If your medication is too expensive, please contact our office at [phone] option 4 or send us a message through MyChart.   We are unable to tell what your co-pay for medications will be in advance as this is different depending on your insurance coverage. However, we may be able to find a substitute medication at lower cost or fill out paperwork to get insurance to cover a needed medication.   If a prior authorization is required to get your medication covered by your insurance company, please allow us 1-2 business days to complete this process.  Drug prices often vary depending on where the prescription is filled and some pharmacies may offer cheaper prices.  The website www.goodrx.com contains coupons for medications through different pharmacies. The prices here do not account for what the cost may be with help from insurance (it may be cheaper with your insurance), but the website can give you the price if you did not use any insurance.  - You can print the associated coupon and take it with   your prescription to the pharmacy.  - You may also stop by our office during regular business hours and pick up a GoodRx coupon card.  - If you need your prescription sent electronically to a different pharmacy, notify our office through [facility] MyChart or by phone at [phone] option 4.     Si Usted Necesita Algo Despus de Su Visita  Tambin puede enviarnos un mensaje a travs de MyChart. Por lo general respondemos a los mensajes de MyChart en el transcurso de 1 a 2  das hbiles.  Para renovar recetas, por favor pida a su farmacia que se ponga en contacto con nuestra oficina. Nuestro nmero de fax es el [phone].  Si tiene un asunto urgente cuando la clnica est cerrada y que no puede esperar hasta el siguiente da hbil, puede llamar/localizar a su doctor(a) al nmero que aparece a continuacin.   Por favor, tenga en cuenta que aunque hacemos todo lo posible para estar disponibles para asuntos urgentes fuera del horario de oficina, no estamos disponibles las 24 horas del da, los 7 das de la semana.   Si tiene un problema urgente y no puede comunicarse con nosotros, puede optar por buscar atencin mdica  en el consultorio de su doctor(a), en una clnica privada, en un centro de atencin urgente o en una sala de emergencias.  Si tiene una emergencia mdica, por favor llame inmediatamente al 911 o vaya a la sala de emergencias.  Nmeros de bper  - Dr. Kowalski: [phone]  - Dra. Moye: [phone]  - Dra. Stewart: [phone]  En caso de inclemencias del tiempo, por favor llame a nuestra lnea principal al [phone] para una actualizacin sobre el estado de cualquier retraso o cierre.  Consejos para la medicacin en dermatologa: Por favor, guarde las cajas en las que vienen los medicamentos de uso tpico para ayudarle a seguir las instrucciones sobre dnde y cmo usarlos. Las farmacias generalmente imprimen las instrucciones del medicamento slo en las cajas y no directamente en los tubos del medicamento.   Si su medicamento es muy caro, por favor, pngase en contacto con nuestra oficina llamando al [phone] y presione la opcin 4 o envenos un mensaje a travs de MyChart.   No podemos decirle cul ser su copago por los medicamentos por adelantado ya que esto es diferente dependiendo de la cobertura de su seguro. Sin embargo, es posible que podamos encontrar un medicamento sustituto a menor costo o llenar un formulario para que el  seguro cubra el medicamento que se considera necesario.   Si se requiere una autorizacin previa para que su compaa de seguros cubra su medicamento, por favor permtanos de 1 a 2 das hbiles para completar este proceso.  Los precios de los medicamentos varan con frecuencia dependiendo del lugar de dnde se surte la receta y alguna farmacias pueden ofrecer precios ms baratos.  El sitio web www.goodrx.com tiene cupones para medicamentos de diferentes farmacias. Los precios aqu no tienen en cuenta lo que podra costar con la ayuda del seguro (puede ser ms barato con su seguro), pero el sitio web puede darle el precio si no utiliz ningn seguro.  - Puede imprimir el cupn correspondiente y llevarlo con su receta a la farmacia.  - Tambin puede pasar por nuestra oficina durante el horario de atencin regular y recoger una tarjeta de cupones de GoodRx.  - Si necesita que su receta se enve electrnicamente a una farmacia diferente, informe a nuestra oficina a travs de MyChart de [facility]   o por telfono llamando al [phone] y presione la opcin 4.  

## 2022-10-07 NOTE — Progress Notes (Signed)
   Follow-Up Visit   Subjective  Laurita Peron is a 52 y.o. female who presents for the following: Botox for facial elastosis    The following portions of the chart were reviewed this encounter and updated as appropriate: medications, allergies, medical history  Review of Systems:  No other skin or systemic complaints except as noted in HPI or Assessment and Plan.  Objective  Well appearing patient in no apparent distress; mood and affect are within normal limits.  A focused examination was performed of the face.  Relevant physical exam findings are noted in the Assessment and Plan.   Injection map photo     Assessment & Plan    Facial Elastosis  Location: See attached image  Informed consent: Discussed risks (infection, pain, bleeding, bruising, swelling, allergic reaction, paralysis of nearby muscles, eyelid droop, double vision, neck weakness, difficulty breathing, headache, undesirable cosmetic result, and need for additional treatment) and benefits of the procedure, as well as the alternatives.  Informed consent was obtained.  Preparation: The area was cleansed with alcohol.  Procedure Details:  Botox was injected into the dermis with a 30-gauge needle. Pressure applied to any bleeding. Ice packs offered for swelling.  Lot Number:  [id number] Expiration:  09/2024  Total Units Injected:  55  Plan: Tylenol may be used for headache.  Allow 2 weeks before returning to clinic for additional dosing as needed. Patient will call for any problems.  Return for 3 - 4 month botox.  IAsher Muir, CMA, am acting as scribe for Armida Sans, MD.   Documentation: I have reviewed the above documentation for accuracy and completeness, and I agree with the above.  Armida Sans, MD

## 2022-10-09 ENCOUNTER — Encounter: Payer: Self-pay | Admitting: Dermatology

## 2022-10-12 ENCOUNTER — Encounter: Payer: Self-pay | Admitting: *Deleted

## 2022-10-27 ENCOUNTER — Other Ambulatory Visit: Payer: Self-pay | Admitting: Obstetrics and Gynecology

## 2022-10-27 DIAGNOSIS — F5101 Primary insomnia: Secondary | ICD-10-CM

## 2023-02-02 ENCOUNTER — Ambulatory Visit: Payer: BC Managed Care – PPO | Admitting: Dermatology

## 2023-05-11 ENCOUNTER — Telehealth: Payer: Self-pay

## 2023-05-11 DIAGNOSIS — Z131 Encounter for screening for diabetes mellitus: Secondary | ICD-10-CM

## 2023-05-11 DIAGNOSIS — Z13220 Encounter for screening for lipoid disorders: Secondary | ICD-10-CM

## 2023-05-11 DIAGNOSIS — Z0000 Encounter for general adult medical examination without abnormal findings: Secondary | ICD-10-CM

## 2023-05-11 NOTE — Telephone Encounter (Signed)
Patient states she is scheduled with Hel[facility] 05/31/23 for annual. She would like to have labs done prior so results can be discussed at her visit. She is acceptable to any routine labs suggested for her age.

## 2023-05-11 NOTE — Telephone Encounter (Signed)
Lab orders placed. Pt needs to be fasting. Can schedule lab appt.

## 2023-05-25 ENCOUNTER — Other Ambulatory Visit: Payer: BC Managed Care – PPO

## 2023-05-25 DIAGNOSIS — Z0000 Encounter for general adult medical examination without abnormal findings: Secondary | ICD-10-CM | POA: Diagnosis not present

## 2023-05-25 DIAGNOSIS — Z13220 Encounter for screening for lipoid disorders: Secondary | ICD-10-CM

## 2023-05-25 DIAGNOSIS — Z131 Encounter for screening for diabetes mellitus: Secondary | ICD-10-CM

## 2023-05-26 LAB — COMPREHENSIVE METABOLIC PANEL
ALT: 9 [IU]/L (ref 0–32)
AST: 17 [IU]/L (ref 0–40)
Albumin: 4.5 g/dL (ref 3.9–4.9)
Alkaline Phosphatase: 102 [IU]/L (ref 44–121)
BUN/Creatinine Ratio: 13 (ref 9–23)
BUN: 10 mg/dL (ref 6–24)
Bilirubin Total: 0.3 mg/dL (ref 0.0–1.2)
CO2: 23 mmol/L (ref 20–29)
Calcium: 9.7 mg/dL (ref 8.7–10.2)
Chloride: 104 mmol/L (ref 96–106)
Creatinine, Ser: 0.78 mg/dL (ref 0.57–1.00)
Globulin, Total: 3 g/dL (ref 1.5–4.5)
Glucose: 91 mg/dL (ref 70–99)
Potassium: 5 mmol/L (ref 3.5–5.2)
Sodium: 141 mmol/L (ref 134–144)
Total Protein: 7.5 g/dL (ref 6.0–8.5)
eGFR: 92 mL/min/{1.73_m2} (ref >59)

## 2023-05-26 LAB — CBC WITH DIFFERENTIAL/PLATELET
Basophils Absolute: 0.1 10*3/uL (ref 0.0–0.2)
Basos: 1 %
EOS (ABSOLUTE): 0.1 10*3/uL (ref 0.0–0.4)
Eos: 2 %
Hematocrit: 41 % (ref 34.0–46.6)
Hemoglobin: 13.5 g/dL (ref 11.1–15.9)
Immature Grans (Abs): 0 10*3/uL (ref 0.0–0.1)
Immature Granulocytes: 0 %
Lymphocytes Absolute: 2.5 10*3/uL (ref 0.7–3.1)
Lymphs: 39 %
MCH: 31 pg (ref 26.6–33.0)
MCHC: 32.9 g/dL (ref 31.5–35.7)
MCV: 94 fL (ref 79–97)
Monocytes Absolute: 0.5 10*3/uL (ref 0.1–0.9)
Monocytes: 7 %
Neutrophils Absolute: 3.2 10*3/uL (ref 1.4–7.0)
Neutrophils: 51 %
Platelets: 439 10*3/uL (ref 150–450)
RBC: 4.36 x10E6/uL (ref 3.77–5.28)
RDW: 11.8 % (ref 11.7–15.4)
WBC: 6.3 10*3/uL (ref 3.4–10.8)

## 2023-05-26 LAB — LIPID PANEL
Chol/HDL Ratio: 3.2 {ratio} (ref 0.0–4.4)
Cholesterol, Total: 183 mg/dL (ref 100–199)
HDL: 58 mg/dL (ref >39)
LDL Chol Calc (NIH): 111 mg/dL — ABNORMAL HIGH (ref 0–99)
Triglycerides: 75 mg/dL (ref 0–149)
VLDL Cholesterol Cal: 14 mg/dL (ref 5–40)

## 2023-05-26 LAB — HEMOGLOBIN A1C
Est. average glucose Bld gHb Est-mCnc: 103 mg/dL
Hgb A1c MFr Bld: 5.2 % (ref 4.8–5.6)

## 2023-05-27 ENCOUNTER — Encounter: Payer: Self-pay | Admitting: Obstetrics and Gynecology

## 2023-05-27 NOTE — Progress Notes (Signed)
PCP:  Rica Records, PA-C   Chief Complaint  Patient presents with   Gynecologic Exam    No concerns     HPI:      [name] is a 53 y.o. [id number] whose LMP was No LMP recorded. (Menstrual status: Oral contraceptives)., presents today for her annual examination.  Her menses were absent on OCPs for many yrs; stopped them 6/23, no bleeding since. Started Femhrt last yr, doing well. Tried to go off and sx returned so wants to continue; tried estroven before HRT without relief. Doing zquil for sleep, trazodone didn't work.   Sex activity: single partner, contraception - none/perimenopausal. No pain/bleeding/dryness.  Last Pap: 04/27/22 Results were: no abnormalities /neg HPV DNA  Hx of STDs: HPV with colpo in 2006  Seen for agitation/anxiety 2/21; given clonazepam prn and rarely takes it. Would like RF.   Last mammogram: 07/07/22 Results were: normal--routine follow-up in 12 months There is no FH of breast cancer. There is no FH of ovarian cancer. The patient does self-breast exams.  Tobacco use: The patient denies current or previous tobacco use. Alcohol use: social No drug use.  Exercise: mod active  Colonoscopy: never; declines till age 49; doesn't want colonoscopy, will do cologuard.   She does get adequate calcium and Vitamin D in her diet. Normal labs 2/25, improved lipids.   Past Medical History:  Diagnosis Date   Anemia    Elevated lipids    Heel spur, left 02/2013   History of abnormal cervical Pap smear 2006   positive HRHPV   Tear of meniscus of left knee    Tear of meniscus of right knee     Past Surgical History:  Procedure Laterality Date   COLPOSCOPY  2006   DILATION AND CURETTAGE OF UTERUS  2008   missed abortion   KNEE ARTHROSCOPY Left 04/29/2016   KNEE SURGERY Right 08/01/2013   repair of meniscus    Family History  Problem Relation Age of Onset   Hypertension Mother    Diabetes Sister    Colon cancer Maternal Grandfather 54    Bladder Cancer Maternal Aunt 19   Breast cancer Neg Hx     Social History   Socioeconomic History   Marital status: Married    Spouse name: Not on file   Number of children: 2   Years of education: 16   Highest education level: Not on file  Occupational History   Occupation: Homemaker  Tobacco Use   Smoking status: Never   Smokeless tobacco: Never  Vaping Use   Vaping status: Never Used  Substance and Sexual Activity   Alcohol use: Yes    Alcohol/week: 2.0 standard drinks of alcohol    Types: 2 Glasses of wine per week    Comment: occ   Drug use: No   Sexual activity: Yes    Partners: Male    Birth control/protection: None  Other Topics Concern   Not on file  Social History Narrative   Degree in accounting. Serves as Musician for [employer]   Social Drivers of Corporate investment banker Strain: Not on file  Food Insecurity: Not on file  Transportation Needs: Not on file  Physical Activity: Not on file  Stress: Not on file  Social Connections: Not on file  Intimate Partner Violence: Not on file     Current Outpatient Medications:    clonazePAM (KLONOPIN) 0.5 MG tablet, Take 1 tablet (0.5 mg total) by mouth 2 (two) times  daily as needed for anxiety., Disp: 30 tablet, Rfl: 2   norethindrone-ethinyl estradiol (FEMHRT 1/5) 1-5 MG-MCG TABS tablet, Take 1 tablet by mouth daily., Disp: 84 tablet, Rfl: 3     ROS:  Review of Systems  Constitutional:  Negative for fatigue, fever and unexpected weight change.  Respiratory:  Negative for cough, shortness of breath and wheezing.   Cardiovascular:  Negative for chest pain, palpitations and leg swelling.  Gastrointestinal:  Negative for blood in stool, constipation, diarrhea, nausea and vomiting.  Endocrine: Negative for cold intolerance, heat intolerance and polyuria.  Genitourinary:  Negative for dyspareunia, dysuria, flank pain, frequency, genital sores, hematuria, menstrual problem, pelvic pain, urgency, vaginal bleeding,  vaginal discharge and vaginal pain.  Musculoskeletal:  Negative for arthralgias, back pain, joint swelling and myalgias.  Skin:  Negative for rash.  Neurological:  Negative for dizziness, syncope, light-headedness, numbness and headaches.  Hematological:  Negative for adenopathy.  Psychiatric/Behavioral:  Negative for agitation, confusion, sleep disturbance and suicidal ideas. The patient is not nervous/anxious.   BREAST: No symptoms   Objective: BP 136/77   Pulse (!) 103   Ht 5\' 6"  (1.676 m)   Wt 147 lb (66.7 kg)   BMI 23.73 kg/m    Physical Exam Constitutional:      Appearance: She is well-developed.  Genitourinary:     Vulva normal.     Right Labia: No rash, tenderness or lesions.    Left Labia: No tenderness, lesions or rash.    No vaginal discharge, erythema or tenderness.      Right Adnexa: not tender and no mass present.    Left Adnexa: not tender and no mass present.    No cervical friability or polyp.     Uterus is not enlarged or tender.  Breasts:    Right: No mass, nipple discharge, skin change or tenderness.     Left: No mass, nipple discharge, skin change or tenderness.  Neck:     Thyroid: No thyromegaly.  Cardiovascular:     Rate and Rhythm: Normal rate and regular rhythm.     Heart sounds: Normal heart sounds. No murmur heard. Pulmonary:     Effort: Pulmonary effort is normal.     Breath sounds: Normal breath sounds.  Abdominal:     Palpations: Abdomen is soft.     Tenderness: There is no abdominal tenderness. There is no guarding or rebound.  Musculoskeletal:        General: Normal range of motion.     Cervical back: Normal range of motion.  Lymphadenopathy:     Cervical: No cervical adenopathy.  Neurological:     General: No focal deficit present.     Mental Status: She is alert and oriented to person, place, and time.     Cranial Nerves: No cranial nerve deficit.  Skin:    General: Skin is warm and dry.  Psychiatric:        Mood and Affect:  Mood normal.        Behavior: Behavior normal.        Thought Content: Thought content normal.        Judgment: Judgment normal.  Vitals reviewed.     Assessment/Plan: Encounter for annual routine gynecological examination  Encounter for screening mammogram for malignant neoplasm of breast - Plan: MM 3D SCREENING MAMMOGRAM BILATERAL BREAST; pt to schedule mammo  Screening for colon cancer - Plan: Cologuard; colonoscopy/cologuard discussed. Pt elects cologuard. Ref sent. Will f/u with results.  Vasomotor symptoms due to  menopause - Plan: norethindrone-ethinyl estradiol (FEMHRT 1/5) 1-5 MG-MCG TABS tablet; doing well, Rx RF. F/u prn.   Hormone replacement therapy (HRT) - Plan: norethindrone-ethinyl estradiol (FEMHRT 1/5) 1-5 MG-MCG TABS tablet  Agitation - Plan: clonazePAM (KLONOPIN) 0.5 MG tablet; takes sparingly, Rx RF eRxd.    Meds ordered this encounter  Medications   clonazePAM (KLONOPIN) 0.5 MG tablet    Sig: Take 1 tablet (0.5 mg total) by mouth 2 (two) times daily as needed for anxiety.    Dispense:  30 tablet    Refill:  2   norethindrone-ethinyl estradiol (FEMHRT 1/5) 1-5 MG-MCG TABS tablet    Sig: Take 1 tablet by mouth daily.    Dispense:  84 tablet    Refill:  3             GYN counsel breast self exam, mammography screening, adequate intake of calcium and vitamin D, diet and exercise     F/U  Return in about 1 year (around 05/30/2024).  Marie Benson B. Safira Proffit, PA-C 05/31/2023 3:43 PM

## 2023-05-31 ENCOUNTER — Encounter: Payer: Self-pay | Admitting: Obstetrics and Gynecology

## 2023-05-31 ENCOUNTER — Ambulatory Visit (INDEPENDENT_AMBULATORY_CARE_PROVIDER_SITE_OTHER): Payer: BC Managed Care – PPO | Admitting: Obstetrics and Gynecology

## 2023-05-31 VITALS — BP 136/77 | HR 103 | Ht 66.0 in | Wt 147.0 lb

## 2023-05-31 DIAGNOSIS — Z1211 Encounter for screening for malignant neoplasm of colon: Secondary | ICD-10-CM

## 2023-05-31 DIAGNOSIS — Z1231 Encounter for screening mammogram for malignant neoplasm of breast: Secondary | ICD-10-CM

## 2023-05-31 DIAGNOSIS — Z79890 Hormone replacement therapy (postmenopausal): Secondary | ICD-10-CM

## 2023-05-31 DIAGNOSIS — N951 Menopausal and female climacteric states: Secondary | ICD-10-CM

## 2023-05-31 DIAGNOSIS — Z01419 Encounter for gynecological examination (general) (routine) without abnormal findings: Secondary | ICD-10-CM | POA: Diagnosis not present

## 2023-05-31 DIAGNOSIS — R451 Restlessness and agitation: Secondary | ICD-10-CM

## 2023-05-31 MED ORDER — CLONAZEPAM 0.5 MG PO TABS: 0.5000 mg | ORAL_TABLET | Freq: Two times a day (BID) | ORAL | 2 refills | Status: AC | PRN

## 2023-05-31 MED ORDER — NORETHINDRONE-ETH ESTRADIOL 1-5 MG-MCG PO TABS
1.0000 | ORAL_TABLET | Freq: Every day | ORAL | 3 refills | Status: AC
Start: 2023-05-31 — End: ?

## 2023-05-31 NOTE — Patient Instructions (Signed)
 I value your feedback and you entrusting [place] with your care. If you get a [facility] patient survey, I would appreciate you taking the time to let us know about your experience today. Thank you!  [facility] ([place])--[phone]

## 2023-06-06 DIAGNOSIS — Z1211 Encounter for screening for malignant neoplasm of colon: Secondary | ICD-10-CM | POA: Diagnosis not present

## 2023-06-11 LAB — COLOGUARD: COLOGUARD: NEGATIVE

## 2023-06-12 ENCOUNTER — Encounter: Payer: Self-pay | Admitting: Obstetrics and Gynecology

## 2023-07-08 ENCOUNTER — Ambulatory Visit
Admission: RE | Admit: 2023-07-08 | Discharge: 2023-07-08 | Disposition: A | Discharge status: Home / Self Care | Source: Ambulatory Visit | Attending: Obstetrics and Gynecology | Admitting: Obstetrics and Gynecology

## 2023-07-08 DIAGNOSIS — Z1231 Encounter for screening mammogram for malignant neoplasm of breast: Secondary | ICD-10-CM | POA: Diagnosis not present

## 2023-07-12 ENCOUNTER — Encounter: Payer: Self-pay | Admitting: Obstetrics and Gynecology

## 2023-12-08 ENCOUNTER — Encounter

## 2024-01-17 ENCOUNTER — Ambulatory Visit (INDEPENDENT_AMBULATORY_CARE_PROVIDER_SITE_OTHER)

## 2024-01-17 DIAGNOSIS — D229 Melanocytic nevi, unspecified: Secondary | ICD-10-CM

## 2024-01-17 DIAGNOSIS — L814 Other melanin hyperpigmentation: Secondary | ICD-10-CM | POA: Diagnosis not present

## 2024-01-17 DIAGNOSIS — D1801 Hemangioma of skin and subcutaneous tissue: Secondary | ICD-10-CM

## 2024-01-17 DIAGNOSIS — Z1283 Encounter for screening for malignant neoplasm of skin: Secondary | ICD-10-CM

## 2024-01-17 DIAGNOSIS — W908XXA Exposure to other nonionizing radiation, initial encounter: Secondary | ICD-10-CM

## 2024-01-17 DIAGNOSIS — L578 Other skin changes due to chronic exposure to nonionizing radiation: Secondary | ICD-10-CM

## 2024-01-17 DIAGNOSIS — L821 Other seborrheic keratosis: Secondary | ICD-10-CM | POA: Diagnosis not present

## 2024-01-17 NOTE — Progress Notes (Signed)
    Subjective   Marie Benson is a 53 y.o. female who presents for the following: Lesion(s) of concern . Patient is established patient   Today patient reports: Areas of concern on her right thigh and face  Review of Systems:    No other skin or systemic complaints except as noted in HPI or Assessment and Plan.  The following portions of the chart were reviewed this encounter and updated as appropriate: medications, allergies, medical history  Relevant Medical History:  n/a   Objective  Well appearing patient in no apparent distress; mood and affect are within normal limits. Examination was performed of the: Sun Exposed Exam: Scalp, head, eyes, ears, nose, lips, neck, upper extremities, hands, fingers, fingernails  Examination notable for: SKIN EXAM, Angioma(s): Scattered red vascular papule(s)  , Lentigo/lentigines: Scattered pigmented macules that are tan to brown in color and are somewhat non-uniform in shape and concentrated in the sun-exposed areas, Nevus/nevi: Scattered well-demarcated, regular, pigmented macule(s) and/or papule(s)  , Seborrheic Keratosis(es): Stuck-on appearing keratotic papule(s) on the trunk, none  irritated with redness, crusting, edema, and/or partial avulsion, Actinic Damage/Elastosis: chronic sun damage: dyspigmentation, telangiectasia, and wrinkling  Examination limited by: Shoes or socks , Clothing, and Patient deferred removal       Assessment & Plan   SKIN CANCER SCREENING PERFORMED TODAY.  BENIGN SKIN FINDINGS  - Lentigines  - Seborrheic keratoses  - Hemangiomas   - Nevus/Multiple Benign Nevi - Reassurance provided regarding the benign appearance of lesions noted on exam today; no treatment is indicated in the absence of symptoms/changes. - Reinforced importance of photoprotective strategies including liberal and frequent sunscreen use of a broad-spectrum SPF 30 or greater, use of protective clothing, and sun avoidance for prevention of  cutaneous malignancy and photoaging.  Counseled patient on the importance of regular self-skin monitoring as well as routine clinical skin examinations as scheduled.   ACTINIC DAMAGE - Chronic condition, secondary to cumulative UV/sun exposure - Recommend daily broad spectrum sunscreen SPF 30+ to sun-exposed areas, reapply every 2 hours as needed.  - Staying in the shade or wearing long sleeves, sun glasses (UVA+UVB protection) and wide brim hats (4-inch brim around the entire circumference of the hat) are also recommended for sun protection.  - Call for new or changing lesions.   Level of service outlined above   Procedures, orders, diagnosis for this visit:    There are no diagnoses linked to this encounter.  Return to clinic: Return in about 1 year (around 01/16/2025) for TBSE.  Documentation: I have reviewed the above documentation for accuracy and completeness, and I agree with the above.  Lauraine JAYSON Kanaris, MD

## 2024-01-17 NOTE — Progress Notes (Deleted)
    Subjective   Marie Benson is a 53 y.o. female who presents for the following: Total body skin exam for skin cancer screening and mole check. The patient has spots, moles and lesions to be evaluated, some may be new or changing and the patient may have concern these could be cancer.. Patient is established patient   Today patient reports: ***  Review of Systems:    No other skin or systemic complaints except as noted in HPI or Assessment and Plan.  The following portions of the chart were reviewed this encounter and updated as appropriate: medications, allergies, medical history  Relevant Medical History:  n/a   Objective  Well appearing patient in no apparent distress; mood and affect are within normal limits. Examination was performed of the: Full Skin Examination: scalp, head, eyes, ears, nose, lips, neck, chest, axillae, abdomen, back, buttocks, bilateral upper extremities, bilateral lower extremities, hands, feet, fingers, toes, fingernails, and toenails.   Examination notable for: {Exam:33578}  Examination limited by: Undergarments and Patient deferred removal       Assessment & Plan   SKIN CANCER SCREENING PERFORMED TODAY.  BENIGN SKIN FINDINGS  - Lentigines  - Seborrheic keratoses  - Hemangiomas   - Nevus/Multiple Benign Nevi  - ***  - Reassurance provided regarding the benign appearance of lesions noted on exam today; no treatment is indicated in the absence of symptoms/changes. - Reinforced importance of photoprotective strategies including liberal and frequent sunscreen use of a broad-spectrum SPF 30 or greater, use of protective clothing, and sun avoidance for prevention of cutaneous malignancy and photoaging.  Counseled patient on the importance of regular self-skin monitoring as well as routine clinical skin examinations as scheduled.   ACTINIC DAMAGE - Chronic condition, secondary to cumulative UV/sun exposure - Recommend daily broad spectrum sunscreen SPF  30+ to sun-exposed areas, reapply every 2 hours as needed.  - Staying in the shade or wearing long sleeves, sun glasses (UVA+UVB protection) and wide brim hats (4-inch brim around the entire circumference of the hat) are also recommended for sun protection.  - Call for new or changing lesions.  Level of service outlined above   Procedures, orders, diagnosis for this visit:    There are no diagnoses linked to this encounter.  Return to clinic: No follow-ups on file.  Documentation: I have reviewed the above documentation for accuracy and completeness, and I agree with the above.  Lauraine JAYSON Kanaris, MD

## 2024-01-17 NOTE — Patient Instructions (Signed)
Sunscreen    Who needs sunscreen? Everyone. Sunscreen use can help prevent skin cancer by protecting you from the sun's harmful ultraviolet rays. Anyone can get skin cancer, regardless of age, gender or race. In fact, it is estimated that one in five Americans will develop skin cancer in their lifetime.    Sunscreen alone cannot fully protect you. In addition to wearing sunscreen, dermatologists recommend taking the following steps to protect your skin and find skin cancer early:   Seek shade when appropriate, remembering that the sun's rays are strongest between 10 a.m. and 2 p.m. If your shadow is shorter than you are, seek shade.  Dress to protect yourself from the sun by wearing a lightweight long-sleeved shirt, pants, a wide-brimmed hat and sunglasses, when possible.   Use extra caution near water, snow and sand as they reflect the damaging rays of the sun, which can increase your chance of sunburn.   Get vitamin D safely through a healthy diet that may include vitamin supplements. Don't seek the sun.  Avoid tanning beds. Ultraviolet light from the sun and tanning beds can cause skin cancer and wrinkling. If you want to look tan, you may wish to use a self-tanning product, but continue to use sunscreen with it.    When should I use sunscreen? Every day you go outside--even if you're just walking to and from your form of transportation. The sun emits harmful UV rays year-round. Even on cloudy days, up to 80 percent of the sun's harmful UV rays can penetrate your skin. Snow, sand and water increase the need for sunscreen because they reflect the sun's rays.    How much sunscreen should I use, and how often should I apply it?  Most people only apply 25-50 percent of the recommended amount of sunscreen. Apply enough sunscreen to cover all exposed skin. Most adults need about 1 ounce -- or enough to fill a shot glass -- to fully cover their body.   Don't forget to apply to the tops of your feet, your neck, your ears and the top of your head.  Apply sunscreen to dry skin 15 minutes before going outdoors.   Skin cancer also can form on the lips. To protect your lips, apply a lip balm or lipstick that contains sunscreen with an SPF of 30 or higher.   When outdoors, reapply sunscreen approximately every two hours, or after swimming or sweating, according to the directions on the bottle.     Broad-spectrum sunscreens protect against both UVA and UVB rays. What is the difference between the rays? Sunlight consists of two types of harmful rays that reach the earth -- UVA rays and UVB rays. Overexposure to either can lead to skin cancer. In addition to causing skin cancer, here's what each of these rays do:   UVA rays (or aging rays) can prematurely age your skin, causing wrinkles and age spots, and can pass through window glass.  UVB rays (or burning rays) are the primary cause of sunburn and are blocked by window glass    There is no safe way to tan. Every time you tan, you damage your skin. As this damage builds, you speed up the aging of your skin and increase your risk for all types of skin cancer.    What is the difference between chemical and physical sunscreens? Chemical sunscreens work like a sponge, absorbing the sun's rays. They contain one or more of the following active ingredients: oxybenzone, avobenzone, octisalate, octocrylene, homosalate and   octinoxate. These formulations tend to be easier to rub into the skin without leaving a white residue.     Physical sunscreens work like a shield, sitting sit on the surface of your skin and deflecting the sun's rays. They contain the active ingredients zinc oxide and/or titanium dioxide. Use this sunscreen if you have sensitive skin.     What type of sunscreen should I use? The best type of sunscreen is the one you will use again and again. Just make sure it offers broad-spectrum (UVA and UVB) protection, has an SPF of 30+, and is water-resistant. The kind of sunscreen you use is a matter of personal choice, and may vary depending on the area of the body to be protected. Available sunscreen options include lotions, creams, gels, ointments, wax sticks and sprays.    Recommended physical sunscreens for face:  - Neutrogena Sheer Zinc  - Aveeno Positively Mineral Sensitive  - CeraVe Hydrating Mineral (also has a tinted version)  - La Roche-Posay Anthelios Mineral Face (comes as a cream, lotion, light fluid, and there is also a tinted version).   - EltaMD UV Clear (also has a tinted version)      Recommended physical sunscreens for body:  - Neutrogena Sheer Zinc Dry-Touch Sunscreen Sensitive Skin Lotion Broad Spectrum SPF 50  - Aveeno Positively Mineral Sensitive Skin Sunscreen Broad Spectrum SPF 50  - La Roche-Posay Anthelios SPF 50 Mineral Sunscreen - Gentle Lotion  - CeraVe Hydrating Mineral Sunscreen SPF 50        Recommended chemical sunscreens for face:  - Anthelios UV Correct Face Sunscreen SPF 70 with Niacinamide  - Neutrogena Clear Face Oil-Free SPF 50 with Helioplex  - Neutrogena Sport Face Oil-Free SPF 70+ with Helioplex  - Aveeno Protect + Hydrate Sunscreen For Face SPF 70  - La Roche-Posay Anthelios Light Fluid Sunscreen for Face SPF 60      Recommended chemical sunscreens for body:  - Neutrogena Ultra Sheer Dry-Touch Sunscreen SPF 70  - Aveeno Protect + Hydrate Broad Spectrum All-Day Hydration SPF 60 (comes in a big pump)  - La Roche-Posay Anthelios Melt-In Milk Sunscreen SPF 60

## 2024-04-17 ENCOUNTER — Other Ambulatory Visit: Payer: Self-pay | Admitting: Obstetrics and Gynecology

## 2024-04-17 DIAGNOSIS — R451 Restlessness and agitation: Secondary | ICD-10-CM

## 2024-05-31 ENCOUNTER — Ambulatory Visit: Admitting: Registered Nurse

## 2025-01-17 ENCOUNTER — Ambulatory Visit
